# Patient Record
Sex: Male | Born: 1950 | Race: White | Hispanic: No | Marital: Married | Smoking: Former smoker
Health system: Southern US, Community
[De-identification: ages and names within clinical notes are randomized; demographics above are authoritative.]

## PROBLEM LIST (undated history)

## (undated) DIAGNOSIS — R531 Weakness: Secondary | ICD-10-CM

## (undated) DIAGNOSIS — I10 Essential (primary) hypertension: Secondary | ICD-10-CM

## (undated) DIAGNOSIS — R0602 Shortness of breath: Secondary | ICD-10-CM

## (undated) HISTORY — PX: APPENDECTOMY: SHX54

## (undated) HISTORY — DX: Weakness: R53.1

## (undated) HISTORY — DX: Essential (primary) hypertension: I10

## (undated) HISTORY — PX: BACK SURGERY: SHX140

## (undated) HISTORY — DX: Shortness of breath: R06.02

---

## 2001-01-15 ENCOUNTER — Ambulatory Visit: Admission: RE | Admit: 2001-01-15 | Discharge: 2001-01-15 | Payer: Self-pay | Admitting: Family Medicine

## 2012-08-24 HISTORY — PX: BACK SURGERY: SHX140

## 2012-08-24 HISTORY — PX: APPENDECTOMY: SHX54

## 2013-06-28 DIAGNOSIS — I10 Essential (primary) hypertension: Secondary | ICD-10-CM | POA: Insufficient documentation

## 2015-11-12 DIAGNOSIS — C490 Malignant neoplasm of connective and soft tissue of head, face and neck: Secondary | ICD-10-CM | POA: Diagnosis not present

## 2015-11-12 DIAGNOSIS — C44129 Squamous cell carcinoma of skin of left eyelid, including canthus: Secondary | ICD-10-CM | POA: Diagnosis not present

## 2015-11-12 DIAGNOSIS — C44122 Squamous cell carcinoma of skin of right eyelid, including canthus: Secondary | ICD-10-CM | POA: Diagnosis not present

## 2016-12-29 DIAGNOSIS — S82445B Nondisplaced spiral fracture of shaft of left fibula, initial encounter for open fracture type I or II: Secondary | ICD-10-CM | POA: Diagnosis not present

## 2017-01-05 DIAGNOSIS — S82832D Other fracture of upper and lower end of left fibula, subsequent encounter for closed fracture with routine healing: Secondary | ICD-10-CM | POA: Diagnosis not present

## 2017-01-23 DIAGNOSIS — S82832D Other fracture of upper and lower end of left fibula, subsequent encounter for closed fracture with routine healing: Secondary | ICD-10-CM | POA: Diagnosis not present

## 2017-02-20 DIAGNOSIS — S82832D Other fracture of upper and lower end of left fibula, subsequent encounter for closed fracture with routine healing: Secondary | ICD-10-CM | POA: Diagnosis not present

## 2017-03-24 DIAGNOSIS — H524 Presbyopia: Secondary | ICD-10-CM | POA: Diagnosis not present

## 2017-03-24 DIAGNOSIS — H5203 Hypermetropia, bilateral: Secondary | ICD-10-CM | POA: Diagnosis not present

## 2017-03-24 DIAGNOSIS — H52223 Regular astigmatism, bilateral: Secondary | ICD-10-CM | POA: Diagnosis not present

## 2017-08-29 DIAGNOSIS — I10 Essential (primary) hypertension: Secondary | ICD-10-CM | POA: Diagnosis not present

## 2017-09-23 DIAGNOSIS — R05 Cough: Secondary | ICD-10-CM | POA: Diagnosis not present

## 2017-09-23 DIAGNOSIS — R531 Weakness: Secondary | ICD-10-CM | POA: Diagnosis not present

## 2017-09-23 DIAGNOSIS — R0602 Shortness of breath: Secondary | ICD-10-CM | POA: Diagnosis not present

## 2017-09-23 DIAGNOSIS — J209 Acute bronchitis, unspecified: Secondary | ICD-10-CM | POA: Diagnosis not present

## 2017-10-05 DIAGNOSIS — R05 Cough: Secondary | ICD-10-CM | POA: Diagnosis not present

## 2017-10-05 DIAGNOSIS — J209 Acute bronchitis, unspecified: Secondary | ICD-10-CM | POA: Diagnosis not present

## 2017-11-19 DIAGNOSIS — R0602 Shortness of breath: Secondary | ICD-10-CM | POA: Diagnosis not present

## 2017-11-19 DIAGNOSIS — R911 Solitary pulmonary nodule: Secondary | ICD-10-CM | POA: Diagnosis not present

## 2017-11-19 DIAGNOSIS — R05 Cough: Secondary | ICD-10-CM | POA: Diagnosis not present

## 2017-11-19 DIAGNOSIS — R0989 Other specified symptoms and signs involving the circulatory and respiratory systems: Secondary | ICD-10-CM | POA: Diagnosis not present

## 2017-11-22 DIAGNOSIS — R0602 Shortness of breath: Secondary | ICD-10-CM | POA: Insufficient documentation

## 2017-11-22 NOTE — Progress Notes (Deleted)
Cardiology Office Note    Date:  11/22/2017   ID:  [name], DOB [birth year], MRN [id number]  PCP:  No primary care provider on file.  Cardiologist: Sinclair Grooms, MD   No chief complaint on file.   History of Present Illness:  David Castillo is a 67 y.o. male referred by Dr. Rory Percy for evaluation of dyspnea.    Past Medical History:  Diagnosis Date  . SOB (shortness of breath)     *** The histories are not reviewed yet. Please review them in the "History" navigator section and refresh this [place].  Current Medications: Outpatient Medications Prior to Visit  Medication Sig Dispense Refill  . hydrochlorothiazide (HYDRODIURIL) 25 MG tablet Take 25 mg by mouth daily.    . traMADol (ULTRAM) 50 MG tablet Take 1-2 tablets by mouth every 8 (eight) hours as needed for pain.    . hydrochlorothiazide (HYDRODIURIL) 12.5 MG tablet Take 1 tablet by mouth daily.     No facility-administered medications prior to visit.      Allergies:   Penicillins   Social History   Socioeconomic History  . Marital status: Not on file    Spouse name: Not on file  . Number of children: Not on file  . Years of education: Not on file  . Highest education level: Not on file  Social Needs  . Financial resource strain: Not on file  . Food insecurity - worry: Not on file  . Food insecurity - inability: Not on file  . Transportation needs - medical: Not on file  . Transportation needs - non-medical: Not on file  Occupational History  . Not on file  Tobacco Use  . Smoking status: Not on file  Substance and Sexual Activity  . Alcohol use: Not on file  . Drug use: Not on file  . Sexual activity: Not on file  Other Topics Concern  . Not on file  Social History Narrative  . Not on file     Family History:  The patient's ***family history is not on file.   ROS:   Please see the history of present illness.    ***  All other systems reviewed and are negative.   PHYSICAL  EXAM:   VS:  There were no vitals taken for this visit.   GEN: Well nourished, well developed, in no acute distress  HEENT: normal  Neck: no JVD, carotid bruits, or masses Cardiac: ***RRR; no murmurs, rubs, or gallops,no edema  Respiratory:  clear to auscultation bilaterally, normal work of breathing GI: soft, nontender, nondistended, + BS MS: no deformity or atrophy  Skin: warm and dry, no rash Neuro:  Alert and Oriented x 3, Strength and sensation are intact Psych: euthymic mood, full affect  Wt Readings from Last 3 Encounters:  No data found for Wt      Studies/Labs Reviewed:   EKG:  EKG  ***  Recent Labs: No results found for requested labs within last 8760 hours.   Lipid Panel No results found for: CHOL, TRIG, HDL, CHOLHDL, VLDL, LDLCALC, LDLDIRECT  Additional studies/ records that were reviewed today include:  ***    ASSESSMENT:    1. Dyspnea, unspecified type      PLAN:  In order of problems listed above:  1. ***    Medication Adjustments/Labs and Tests Ordered: Current medicines are reviewed at length with the patient today.  Concerns regarding medicines are outlined above.  Medication changes, Labs and Tests ordered today  are listed in the Patient Instructions below. There are no Patient Instructions on file for this visit.   Signed, Sinclair Grooms, MD  11/22/2017 8:53 PM    [facility] [address], [place], [place]  [address] Phone: [phone]; Fax: [phone]

## 2017-11-23 ENCOUNTER — Ambulatory Visit: Payer: Self-pay | Admitting: Interventional Cardiology

## 2017-11-27 DIAGNOSIS — R531 Weakness: Secondary | ICD-10-CM | POA: Diagnosis not present

## 2017-11-27 DIAGNOSIS — I10 Essential (primary) hypertension: Secondary | ICD-10-CM | POA: Diagnosis not present

## 2017-11-27 DIAGNOSIS — R0602 Shortness of breath: Secondary | ICD-10-CM | POA: Diagnosis not present

## 2017-12-03 DIAGNOSIS — I517 Cardiomegaly: Secondary | ICD-10-CM | POA: Diagnosis not present

## 2017-12-03 DIAGNOSIS — R0602 Shortness of breath: Secondary | ICD-10-CM | POA: Diagnosis not present

## 2017-12-03 DIAGNOSIS — I700 Atherosclerosis of aorta: Secondary | ICD-10-CM | POA: Diagnosis not present

## 2017-12-03 DIAGNOSIS — J45909 Unspecified asthma, uncomplicated: Secondary | ICD-10-CM | POA: Diagnosis not present

## 2017-12-03 LAB — PULMONARY FUNCTION TEST

## 2017-12-21 ENCOUNTER — Encounter: Payer: Self-pay | Admitting: Pulmonary Disease

## 2017-12-21 ENCOUNTER — Ambulatory Visit: Payer: Medicare Other | Admitting: Pulmonary Disease

## 2017-12-21 DIAGNOSIS — R05 Cough: Secondary | ICD-10-CM | POA: Diagnosis not present

## 2017-12-21 DIAGNOSIS — J849 Interstitial pulmonary disease, unspecified: Secondary | ICD-10-CM | POA: Diagnosis not present

## 2017-12-21 DIAGNOSIS — R053 Chronic cough: Secondary | ICD-10-CM

## 2017-12-21 NOTE — Patient Instructions (Signed)
You likely had a bad case of acute bronchitis that is resolving. You may have mild scarring at the bases of the lungs.  Stay on Symbicort until symptoms resolve.  High-resolution CT of the chest in 6 months

## 2017-12-21 NOTE — Assessment & Plan Note (Signed)
He does seem to have had a attack of severe acute bronchitis with some hypoxia and bronchospasm which took 3 months to resolve.  He is now 80% improved but a cough with clear sputum still lingers. No infiltrates are noted on his CT scan which is reassuring.  He certainly does not need further antibiotics or  Steroids. Difficult to interpret spirometry when he was acutely ill but the pre-bronchodilator FEV1 ratio does appear to be low at 68 hence okay to continue taking Symbicort. We will recheck pre-and post spirometry on a future visit in 3 months.  I doubt that he has significant COPD

## 2017-12-21 NOTE — Assessment & Plan Note (Signed)
CT scan was read as mosaic attenuation and small airways disease which could be related to the episode of acute bronchitis.  However I am more worried about early ILD especially on my review of the CT scan. We will need a high-resolution CT scan, also note that he has bibasilar crackles on exam will wait for complete resolution of current Symptoms before checking

## 2017-12-21 NOTE — Progress Notes (Signed)
Subjective:    Patient ID: David Castillo, male    DOB: 03/08/1951, 67 y.o.   MRN: [id number]  HPI  Chief Complaint  Patient presents with  . Pulm Consult    Referred by Dr.Howard at [facility] in [place]. Per patient's wife, patient has been SOB since before December 2018. Increased fatigue. Increased coughing, productive cough with brown/black phlegm.    67 year old remote smoker presents for evaluation of persistent shortness of breath and "chest congestion". 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  He is convinced that he had flulike symptoms around then although he tested negative.  He had severe fatigue, chest congestion with green/yellow sputum production, his wife shows me a picture and increased wheezing.  Since then he has been treated with 2 rounds of antibiotics and 2 rounds of steroids.  He also received codeine cough syrup with 2 refills. His dyspnea is about 80% improved, he continues to have congestion but now has clear sputum, his wheezing has almost resolved.  He feels that wheezing improved most significantly after starting on Symbicort, but admits that he is only taking 1-2 puffs once daily mostly on an as-needed basis. He has lost 25 pounds from his previous weight of 244 to his current weight of 219 pounds.  He denies hemoptysis or fevers although he had night sweats about 2 nights ago. He was noted to be hypoxic as low as 86% on exertion, today measures 93%  He smoked about a pack per day for about 20 years until he quit in 1978.  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  FEV1 did not change much. Echo 12/03/17 showed  mild LVH, mildly dilated left atrium, grade 1 diastolic dysfunction.  CT chest 11/19/17 showed mosaic perfusion of the lungs and to my review, mild scarring at the bases, 8 mm groundglass opacity right lung apex, ascending thoracic aorta 40 mm      No past medical history on file.   History reviewed. No pertinent surgical history. Appendectomy 66, back surgery 1972   Allergies  Allergen Reactions  . Penicillins Anaphylaxis    Per patient, this is a childhood allergy.     Social History   Socioeconomic History  . Marital status: Married    Spouse name: Not on file  . Number of children: Not on file  . Years of education: Not on file  . Highest education level: Not on file  Occupational History  . Not on file  Social Needs  . Financial resource strain: Not on file  . Food insecurity:    Worry: Not on file    Inability: Not on file  . Transportation needs:    Medical: Not on file    Non-medical: Not on file  Tobacco Use  . Smoking status: Former Smoker    Last attempt to quit: 09/22/1976    Years since quitting: 41.2  . Smokeless tobacco: Never Used  Substance and Sexual Activity  . Alcohol use: Not on file  . Drug use: Not on file  . Sexual activity: Not on file  Lifestyle  . Physical activity:    Days per week: Not  on file    Minutes per session: Not on file  . Stress: Not on file  Relationships  . Social connections:    Talks on phone: Not on file    Gets together: Not on file    Attends religious service: Not on file    Active member of club or organization: Not on file    Attends meetings of clubs or organizations: Not on file    Relationship status: Not on file  . Intimate partner violence:    Fear of current or ex partner: Not on file    Emotionally abused: Not on file    Physically abused: Not on file    Forced sexual activity: Not on file  Other Topics Concern  . Not on file  Social History Narrative  . Not on file      History reviewed. No  pertinent family history. Heart disease in both parents, colon cancer in mother   Review of Systems  Constitutional: Positive for unexpected weight change. Negative for fever.  HENT: Positive for postnasal drip. Negative for congestion, dental problem, ear pain, nosebleeds, rhinorrhea, sinus pressure, sneezing, sore throat and trouble swallowing.   Eyes: Negative for redness and itching.  Respiratory: Positive for cough, chest tightness and shortness of breath. Negative for wheezing.   Cardiovascular: Positive for chest pain. Negative for palpitations and leg swelling.  Gastrointestinal: Positive for abdominal pain. Negative for nausea and vomiting.  Genitourinary: Negative for dysuria.  Musculoskeletal: Negative for joint swelling.  Skin: Negative for rash.  Allergic/Immunologic: Negative.  Negative for environmental allergies, food allergies and immunocompromised state.  Neurological: Negative for headaches.  Hematological: Does not bruise/bleed easily.  Psychiatric/Behavioral: Negative for dysphoric mood. The patient is not nervous/anxious.        Objective:   Physical Exam  Gen. Pleasant, well-nourished, in no distress, normal affect ENT - no lesions, no post nasal drip Neck: No JVD, no thyromegaly, no carotid bruits Lungs: no use of accessory muscles, no dullness to percussion, fine bibasilar rales no rhonchi  Cardiovascular: Rhythm regular, heart sounds  normal, no murmurs or gallops, no peripheral edema Abdomen: soft and non-tender, no hepatosplenomegaly, BS normal. Musculoskeletal: No deformities, no cyanosis or clubbing Neuro:  alert, non focal       Assessment & Plan:

## 2017-12-23 ENCOUNTER — Encounter: Payer: Self-pay | Admitting: *Deleted

## 2017-12-24 ENCOUNTER — Ambulatory Visit: Payer: Self-pay | Admitting: Cardiovascular Disease

## 2018-01-01 ENCOUNTER — Institutional Professional Consult (permissible substitution): Payer: Self-pay | Admitting: Pulmonary Disease

## 2018-01-15 ENCOUNTER — Ambulatory Visit: Payer: Self-pay | Admitting: Cardiovascular Disease

## 2018-02-02 ENCOUNTER — Ambulatory Visit: Payer: Medicare Other | Admitting: Cardiovascular Disease

## 2018-02-02 ENCOUNTER — Encounter: Payer: Self-pay | Admitting: *Deleted

## 2018-02-02 ENCOUNTER — Encounter

## 2018-02-02 ENCOUNTER — Encounter: Payer: Self-pay | Admitting: Cardiovascular Disease

## 2018-02-02 ENCOUNTER — Other Ambulatory Visit: Payer: Self-pay

## 2018-02-02 VITALS — BP 165/88 | HR 69 | Ht 72.0 in | Wt 228.0 lb

## 2018-02-02 DIAGNOSIS — I10 Essential (primary) hypertension: Secondary | ICD-10-CM | POA: Diagnosis not present

## 2018-02-02 DIAGNOSIS — R0609 Other forms of dyspnea: Secondary | ICD-10-CM

## 2018-02-02 NOTE — Progress Notes (Addendum)
CARDIOLOGY CONSULT NOTE  Patient ID: David Castillo MRN: [id number] DOB/AGE: 10/05/1950 67 y.o.  Admit date: (Not on file) Primary Physician: Rory Percy, MD Referring Physician: Rory Percy, MD  Reason for Consultation: Exertional dyspnea  HPI: David Castillo is a 67 y.o. male who is being seen today for the evaluation of exertional dyspnea at the request of Lanelle Bal, [place].   Past medical history includes hypertension.  I reviewed all relevant documentation, labs, and studies provided by his PCP.  He reportedly has a 63-month history of exertional dyspnea.  He had a 20 to 30 pound weight loss in 2 months.  He has been expensing a productive cough.  Oxygen saturations have been 89-91% at rest dropping down to 86% with exertion.  He is a previous smoker and quit at age 47 but smoked 2 packs/day for 20 years.  It appears a stress test, echocardiogram, and pulmonary function testing were ordered by his PCP.  I personally reviewed the recent ECG which demonstrated sinus rhythm with nonspecific T wave abnormalities in high lateral leads.  Labs 11/11/2017: BUN 13, creatinine 0.71.  Additional labs 09/23/2017: Hemoglobin 15.6, platelets 234, normal troponin, N-terminal proBNP 100.1.  At the present time, I do not have results of the echocardiogram, CT scan, pulmonary function tests, and stress test.  He saw pulmonary on 12/21/2017.  It was not felt he had significant COPD.  It was felt that the CT scan showed mosaic attenuation and small airways disease but there was also concern for early interstitial lung disease.  A high-resolution CT scan was ordered which has not been performed.  He told me he was experiencing cough and congestion for about 3 months.  He was extremely fatigued.  He then used an inhaler and began to cough up sputum.  He was switched to a different inhaler and he began to feel much better.  He now denies exertional chest pain, dyspnea, and fatigue.  He is now  walking 1 mile daily with his wife.  He had been on hydrochlorothiazide but it made him feel dizzy and he stopped it.  He took lisinopril for a week but did not tolerate it.  He checks his blood pressure at home routinely and it is normal.  He initially went from 245 down to 216 pounds but is now back up to 228 pounds.  Family history: Father died of CVA at age 53.  He was a smoker.  He also had a carotid endarterectomy.  Mother developed coronary artery disease in her mid 6s and had a stent.  She was a smoker.  His brother had an MI at age 59 and he is a smoker.       Allergies  Allergen Reactions  . Penicillins Anaphylaxis    Per patient, this is a childhood allergy.     No current outpatient medications on file.   No current facility-administered medications for this visit.     Past Medical History:  Diagnosis Date  . Hypertension   . Shortness of breath   . Weakness     Past Surgical History:  Procedure Laterality Date  . APPENDECTOMY    . BACK SURGERY      Social History   Socioeconomic History  . Marital status: Married    Spouse name: Not on file  . Number of children: Not on file  . Years of education: Not on file  . Highest education level: Not on file  Occupational  History  . Not on file  Social Needs  . Financial resource strain: Not on file  . Food insecurity:    Worry: Not on file    Inability: Not on file  . Transportation needs:    Medical: Not on file    Non-medical: Not on file  Tobacco Use  . Smoking status: Former Smoker    Last attempt to quit: 09/22/1976    Years since quitting: 41.3  . Smokeless tobacco: Never Used  Substance and Sexual Activity  . Alcohol use: Not on file  . Drug use: Not on file  . Sexual activity: Not on file  Lifestyle  . Physical activity:    Days per week: Not on file    Minutes per session: Not on file  . Stress: Not on file  Relationships  . Social connections:    Talks on phone: Not on file    Gets  together: Not on file    Attends religious service: Not on file    Active member of club or organization: Not on file    Attends meetings of clubs or organizations: Not on file    Relationship status: Not on file  . Intimate partner violence:    Fear of current or ex partner: Not on file    Emotionally abused: Not on file    Physically abused: Not on file    Forced sexual activity: Not on file  Other Topics Concern  . Not on file  Social History Narrative  . Not on file      No outpatient medications have been marked as taking for the 02/02/18 encounter (Office Visit) with [name], MD.      Review of systems complete and found to be negative unless listed above in HPI    Physical exam Blood pressure (!) 165/88, pulse 69, height 6' (1.829 m), weight 228 lb (103.4 kg), SpO2 96 %. General: NAD Neck: No JVD, no thyromegaly or thyroid nodule.  Lungs: Clear to auscultation bilaterally with normal respiratory effort. CV: Nondisplaced PMI. Regular rate and rhythm, normal S1/S2, no S3/S4, no murmur.  No peripheral edema.  No carotid bruit.   Abdomen: Soft, nontender, no distention.  Skin: Intact without lesions or rashes.  Neurologic: Alert and oriented x 3.  Psych: Normal affect. Extremities: No clubbing or cyanosis.  HEENT: Normal.   ECG: Most recent ECG reviewed.   Labs: No results found for: K, BUN, CREATININE, ALT, TSH, HGB   Lipids: No results found for: LDLCALC, LDLDIRECT, CHOL, TRIG, HDL      ASSESSMENT AND PLAN:  1.  Exertional dyspnea and hypoxemia: Symptoms have resolved and appear to have been pulmonary in etiology.  I will request echocardiogram, stress test, and pulmonary function test results as well as a CT scan.  Oxygen saturations are 96% and he denies exertional symptoms altogether.  At this time, no further testing is indicated.  2.  Hypertension: Blood pressure is elevated.  However, he checks it routinely at home and it is  normal.     Disposition: Follow up as needed   Signed: Kate Sable, M.D., F.A.C.C.  02/02/2018, 9:35 AM   Addendum:  I reviewed records obtained from [facility].  Echocardiogram demonstrated normal left ventricular systolic function, LVEF 55 to 60%, mild LVH, mild left atrial dilatation, mild aortic sclerosis, mildly dilated ascending aorta.  It was 4.1 cm.  Nuclear stress test showed no reversible ischemia or infarction, LVEF 68%.  It was a low risk  study.

## 2018-02-02 NOTE — Patient Instructions (Signed)
Medication Instructions:  Continue all current medications.  Labwork: none  Testing/Procedures: none  Follow-Up: As needed.    Any Other Special Instructions Will Be Listed Below (If Applicable).  If you need a refill on your cardiac medications before your next appointment, please call your pharmacy.  

## 2018-02-03 ENCOUNTER — Telehealth: Payer: Self-pay | Admitting: *Deleted

## 2018-02-03 NOTE — Telephone Encounter (Signed)
David Castillo today  Received: Yesterday - review of [name] notes -   Message Contents  [name], MD  Laurine Blazer, LPN        He had very mild ascending aortic dilatation. Would recommend his PCP follow up on this with annual imaging if deemed necessary. It may have always been like this and part of his normal anatomy.   He does not need to follow up with me.   Stress test was normal. Echo showed normal pumping function.

## 2018-02-03 NOTE — Telephone Encounter (Signed)
Wife notified & appreciative of the call.

## 2018-06-24 ENCOUNTER — Ambulatory Visit (HOSPITAL_COMMUNITY): Payer: Medicare Other

## 2018-08-02 ENCOUNTER — Telehealth: Payer: Self-pay | Admitting: Pulmonary Disease

## 2018-08-02 NOTE — Telephone Encounter (Signed)
Left message for Deanna to call back

## 2018-08-03 NOTE — Telephone Encounter (Signed)
Noted.  Will close encounter.  

## 2018-08-03 NOTE — Telephone Encounter (Signed)
Called and spoke with pt's spouse Deanna letting her know that an order had been placed for pt to have CT scan performed 10/3 but the scan was cancelled by pt.  Per Deanna, pt's mom has been under hospice care at their home and that was the reason the scan was cancelled.  Pt is ready to have the scan performed.  PCCs, can you help [place] out with this. Please let us know if we need to place another order or if the previous order for the scan will be okay. Thanks!

## 2018-08-03 NOTE — Telephone Encounter (Signed)
Called David Castillo but there was no answer. Left message to call back.

## 2018-08-03 NOTE — Telephone Encounter (Signed)
Order is still showing active. Pt should be able to call Central Scheduling & reschedule appt.  Ph # [phone].

## 2018-08-03 NOTE — Telephone Encounter (Signed)
Patients wife called about CT scan.  Gave her central scheduling phone number.  No call back needed.

## 2018-08-20 ENCOUNTER — Ambulatory Visit (HOSPITAL_COMMUNITY): Payer: Medicare Other | Attending: Pulmonary Disease

## 2018-10-07 ENCOUNTER — Ambulatory Visit (HOSPITAL_COMMUNITY): Payer: Medicare Other

## 2018-10-21 ENCOUNTER — Ambulatory Visit (HOSPITAL_COMMUNITY)
Admission: RE | Admit: 2018-10-21 | Discharge: 2018-10-21 | Disposition: A | Discharge status: Home / Self Care | Payer: Medicare Other | Source: Ambulatory Visit | Attending: Pulmonary Disease | Admitting: Pulmonary Disease

## 2018-10-21 DIAGNOSIS — J849 Interstitial pulmonary disease, unspecified: Secondary | ICD-10-CM | POA: Insufficient documentation

## 2018-10-21 DIAGNOSIS — R0602 Shortness of breath: Secondary | ICD-10-CM | POA: Diagnosis not present

## 2018-10-28 ENCOUNTER — Telehealth: Payer: Self-pay | Admitting: Pulmonary Disease

## 2018-10-28 NOTE — Telephone Encounter (Signed)
Called and spoke with Deanna (DPR). Dr Elsworth Soho CT recommendations given.  Appointment scheduled with Dr Elsworth Soho, 11/14/18, at 2:30pm.  Nothing further at this time.

## 2018-11-04 ENCOUNTER — Ambulatory Visit: Payer: Medicare Other | Admitting: Pulmonary Disease

## 2018-11-18 ENCOUNTER — Ambulatory Visit: Payer: Medicare Other | Admitting: Pulmonary Disease

## 2018-11-23 ENCOUNTER — Encounter: Payer: Self-pay | Admitting: Cardiovascular Disease

## 2018-11-29 ENCOUNTER — Ambulatory Visit: Payer: Medicare Other | Admitting: Pulmonary Disease

## 2018-12-01 ENCOUNTER — Other Ambulatory Visit: Payer: Self-pay

## 2018-12-01 ENCOUNTER — Ambulatory Visit: Payer: Medicare Other | Admitting: Pulmonary Disease

## 2018-12-01 ENCOUNTER — Encounter: Payer: Self-pay | Admitting: Pulmonary Disease

## 2018-12-01 VITALS — BP 118/72 | HR 70 | Wt 240.0 lb

## 2018-12-01 DIAGNOSIS — J849 Interstitial pulmonary disease, unspecified: Secondary | ICD-10-CM | POA: Diagnosis not present

## 2018-12-01 MED ORDER — PREDNISONE 10 MG PO TABS: ORAL_TABLET | ORAL | 0 refills | Status: DC

## 2018-12-01 NOTE — Patient Instructions (Addendum)
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

## 2018-12-01 NOTE — Progress Notes (Signed)
   Subjective:    Patient ID: David Castillo, male    DOB: 11/08/1950, 68 y.o.   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 Follow-up    Had CT scan back in January 2020. Patient is still having issues with SOB.    Initial consult 12/2017 after an episode of acute bronchitis, cough resolved but he continues to have persistent shortness of breath. Imaging then showed mild mosaic perfusion at the lung bases and spirometry suggested mild airway obstruction which could be attributed to recovering bronchitis.  He was given Symbicort and albuterol, Symbicort has not helped him much and he is stopped taking this over the past few months.  Albuterol helped him more.  We reviewed repeat HRCT today.  He also reports intermittent wheezing especially on exertion Accompanied by his wife today who corroborates his symptoms  Significant tests/ events reviewed  HRCT 10/21/18 >>" indeterminate ", scattered areas of septal thickening and subpleural reticulation in both lower lobes, stable compared to 10/2017  Spirometry 11/2016 showed ratio of 68 with FEV1 of 72% and FVC 53% that improved 18% with bronchodilators of 63%.  FEV1 did not change much. Echo 12/03/17 showed mild LVH, mildly dilated left atrium, grade 1 diastolic dysfunction.  CT chest 11/19/17 showed mosaic perfusion of the lungs and to my review, mild scarring at the bases, 8 mm groundglass opacity right lung apex, ascending thoracic aorta 40 mm  Review of Systems neg for any significant sore throat, dysphagia, itching, sneezing, nasal congestion or excess/ purulent secretions, fever, chills, sweats, unintended wt loss, pleuritic or exertional cp, hempoptysis, orthopnea pnd or change in chronic leg swelling. Also denies presyncope, palpitations, heartburn, abdominal pain, nausea, vomiting,  diarrhea or change in bowel or urinary habits, dysuria,hematuria, rash, arthralgias, visual complaints, headache, numbness weakness or ataxia.     Objective:   Physical Exam  Gen. Pleasant, obese, in no distress ENT - no lesions, no post nasal drip Neck: No JVD, no thyromegaly, no carotid bruits Lungs: no use of accessory muscles, no dullness to percussion,bibasal rales no rhonchi  Cardiovascular: Rhythm regular, heart sounds  normal, no murmurs or gallops, no peripheral edema Musculoskeletal: No deformities, no cyanosis or clubbing , no tremors       Assessment & Plan:

## 2018-12-02 ENCOUNTER — Encounter: Payer: Self-pay | Admitting: Pulmonary Disease

## 2018-12-02 NOTE — Assessment & Plan Note (Signed)
Appears to be unchanged compared to 2019 but he continues to have persistent dyspnea He does not want to repeat PFTs.  Spirometry in the past has shown moderate airway obstruction but Symbicort did not help him much  Will give a short course of prednisone as a treatment trial he will report back if this helps. If not, we will refill his Symbicort or change her to alternative LABA/LAMA combination

## 2018-12-03 LAB — CBC WITH DIFFERENTIAL/PLATELET
ABSOLUTE MONOCYTES: 564 {cells}/uL (ref 200–950)
Basophils Absolute: 31 cells/uL (ref 0–200)
Basophils Relative: 0.5 %
Eosinophils Absolute: 285 cells/uL (ref 15–500)
Eosinophils Relative: 4.6 %
HCT: 41.4 % (ref 38.5–50.0)
Hemoglobin: 14.7 g/dL (ref 13.2–17.1)
Lymphs Abs: 2220 cells/uL (ref 850–3900)
MCH: 30.2 pg (ref 27.0–33.0)
MCHC: 35.5 g/dL (ref 32.0–36.0)
MCV: 85 fL (ref 80.0–100.0)
MPV: 10.2 fL (ref 7.5–12.5)
Monocytes Relative: 9.1 %
Neutro Abs: 3100 cells/uL (ref 1500–7800)
Neutrophils Relative %: 50 %
Platelets: 230 10*3/uL (ref 140–400)
RBC: 4.87 10*6/uL (ref 4.20–5.80)
RDW: 12.4 % (ref 11.0–15.0)
Total Lymphocyte: 35.8 %
WBC: 6.2 10*3/uL (ref 3.8–10.8)

## 2018-12-03 LAB — COMPREHENSIVE METABOLIC PANEL
AG Ratio: 1.5 (calc) (ref 1.0–2.5)
ALBUMIN MSPROF: 4.1 g/dL (ref 3.6–5.1)
ALKALINE PHOSPHATASE (APISO): 55 U/L (ref 35–144)
ALT: 21 U/L (ref 9–46)
AST: 19 U/L (ref 10–35)
BUN: 14 mg/dL (ref 7–25)
CO2: 30 mmol/L (ref 20–32)
Calcium: 9.6 mg/dL (ref 8.6–10.3)
Chloride: 100 mmol/L (ref 98–110)
Creat: 0.88 mg/dL (ref 0.70–1.25)
Globulin: 2.7 g/dL (calc) (ref 1.9–3.7)
Glucose, Bld: 88 mg/dL (ref 65–99)
Potassium: 4.5 mmol/L (ref 3.5–5.3)
Sodium: 140 mmol/L (ref 135–146)
Total Bilirubin: 0.4 mg/dL (ref 0.2–1.2)
Total Protein: 6.8 g/dL (ref 6.1–8.1)

## 2018-12-03 LAB — CYCLIC CITRUL PEPTIDE ANTIBODY, IGG: Cyclic Citrullin Peptide Ab: <16 UNITS

## 2018-12-03 LAB — ANA: Anti Nuclear Antibody(ANA): NEGATIVE

## 2018-12-03 LAB — SEDIMENTATION RATE: Sed Rate: 14 mm/h (ref 0–20)

## 2018-12-10 ENCOUNTER — Telehealth: Payer: Self-pay | Admitting: Pulmonary Disease

## 2018-12-10 NOTE — Telephone Encounter (Signed)
Called patient, unable to reach and unable to leave message.  

## 2018-12-13 NOTE — Telephone Encounter (Signed)
Called and spoke with patient regarding results.  Informed the patient of results and recommendations today. Pt verbalized understanding and denied any questions or concerns at this time.  Nothing further needed.  

## 2019-01-18 ENCOUNTER — Telehealth: Payer: Self-pay | Admitting: Pulmonary Disease

## 2019-01-18 MED ORDER — ALBUTEROL SULFATE HFA 108 (90 BASE) MCG/ACT IN AERS: 2.0000 | INHALATION_SPRAY | RESPIRATORY_TRACT | 2 refills | Status: AC | PRN

## 2019-01-18 NOTE — Telephone Encounter (Signed)
That is fine , it is mentioned in Dr. Elsworth Soho  Note from march 2020  Please call pharmacy and verify .  Can send in 2 refills  proair 2 puffs every 4hr As needed  Wheezing -rescue inhaler #2 refills only   Cont w/ ov recs Please contact office for sooner follow up if symptoms do not improve or worsen or seek emergency care  follow up as planned and As needed

## 2019-01-18 NOTE — Telephone Encounter (Signed)
ATC Patient.  Left message for Patient to call back when available.

## 2019-01-18 NOTE — Telephone Encounter (Signed)
Call made to pharmacy, confirmed medication.Refill sent. Call made to patient wife to make aware. Nothing further is needed at this time.

## 2019-01-18 NOTE — Telephone Encounter (Signed)
Pt wife Tilda Burrow is calling back [phone]

## 2019-01-18 NOTE — Telephone Encounter (Signed)
Called returned to patient wife (dpr), requesting copy of labs and CT be faxed to their home. Fax number confirmed [phone]. Reports faxed.   Also requesting a refill of pro-air. Wife states this was previously prescribed by his PCP but they asked that [facility] manage this moving forward. Pharmacy [facility].   No mention of pro-air on AVS.   TP please advise if okay to send script for Pro-air in Dr. Bari Mantis absence. Thanks.

## 2019-02-18 ENCOUNTER — Other Ambulatory Visit: Payer: Self-pay | Admitting: Pulmonary Disease

## 2019-03-14 ENCOUNTER — Other Ambulatory Visit (HOSPITAL_COMMUNITY): Admission: RE | Admit: 2019-03-14 | Payer: Medicare Other | Source: Ambulatory Visit

## 2019-03-17 ENCOUNTER — Ambulatory Visit: Payer: Medicare Other | Admitting: Pulmonary Disease

## 2019-09-28 DIAGNOSIS — Z0001 Encounter for general adult medical examination with abnormal findings: Secondary | ICD-10-CM | POA: Diagnosis not present

## 2019-09-28 DIAGNOSIS — R079 Chest pain, unspecified: Secondary | ICD-10-CM | POA: Diagnosis not present

## 2019-09-28 DIAGNOSIS — R0602 Shortness of breath: Secondary | ICD-10-CM | POA: Diagnosis not present

## 2019-09-28 DIAGNOSIS — I10 Essential (primary) hypertension: Secondary | ICD-10-CM | POA: Diagnosis not present

## 2019-10-04 DIAGNOSIS — R42 Dizziness and giddiness: Secondary | ICD-10-CM | POA: Diagnosis not present

## 2019-10-04 DIAGNOSIS — R519 Headache, unspecified: Secondary | ICD-10-CM | POA: Diagnosis not present

## 2019-10-12 ENCOUNTER — Other Ambulatory Visit: Payer: Self-pay

## 2019-10-12 DIAGNOSIS — I6529 Occlusion and stenosis of unspecified carotid artery: Secondary | ICD-10-CM

## 2019-10-13 ENCOUNTER — Ambulatory Visit (HOSPITAL_COMMUNITY)
Admission: RE | Admit: 2019-10-13 | Discharge: 2019-10-13 | Disposition: A | Discharge status: Home / Self Care | Payer: Medicare Other | Source: Ambulatory Visit | Attending: Vascular Surgery | Admitting: Vascular Surgery

## 2019-10-13 ENCOUNTER — Ambulatory Visit (INDEPENDENT_AMBULATORY_CARE_PROVIDER_SITE_OTHER): Payer: Medicare Other | Admitting: Vascular Surgery

## 2019-10-13 ENCOUNTER — Other Ambulatory Visit: Payer: Self-pay

## 2019-10-13 ENCOUNTER — Encounter: Payer: Self-pay | Admitting: Vascular Surgery

## 2019-10-13 VITALS — BP 181/96 | HR 82 | Temp 98.0°F | Resp 18 | Ht 71.0 in | Wt 238.0 lb

## 2019-10-13 DIAGNOSIS — I6502 Occlusion and stenosis of left vertebral artery: Secondary | ICD-10-CM | POA: Diagnosis not present

## 2019-10-13 DIAGNOSIS — I6529 Occlusion and stenosis of unspecified carotid artery: Secondary | ICD-10-CM | POA: Insufficient documentation

## 2019-10-13 NOTE — Progress Notes (Signed)
REASON FOR CONSULT:    Occlusion of left vertebral artery.  The consult is requested by Dr. Lanelle Bal.  ASSESSMENT & PLAN:   OCCLUDED LEFT VERTEBRAL ARTERY: The patient's MRI and duplex scan do suggest a stenosis of the left vertebral artery however this is asymptomatic.  He has no symptoms of vertebrobasilar insufficiency no evidence of subclavian steal.  He has no significant carotid disease.  Thus this is simply an incidental finding and no further work-up is indicated.  I have encouraged him to take 81 mg of aspirin daily.  I will be happy to see him back at any time if any new vascular issues arise.  From my standpoint however no routine carotid follow-up studies are indicated at this time.   Deitra Mayo, MD Office: [phone]   HPI:   David Castillo is a pleasant 69 y.o. male, who underwent an MRI to work-up headaches and an incidental finding was possible slow flow versus occlusion of the left vertebral artery.  For this reason the patient was sent for vascular consultation.  I have reviewed the records from the referring office.  The CT was done on 10/04/2019.  There was an abnormal signal in the left vertebral artery suggesting slow flow or occlusion of the left vertebral artery.  There were no other acute findings to explain the patient's headaches.  I reviewed the office note from 09/29/2019 prior to the MRI.  The patient was complaining of a headache that was located in the retro-orbital area.  This prompted the MRI.  Patient was also having some occasional shortness of breath cough.  On my history, the patient is right-handed.  He denies any history of stroke, TIAs, expressive or receptive aphasia, or amaurosis fugax.  He does occasionally get dizziness but this is only associated with standing up quickly.  Sounds like he may have some history of orthostatic hypotension.  He did have a large object fall on his head in the past.  His main complaints are weakness and  shortness of breath.  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  He occasionally takes aspirin but not routinely.  His risk factors for peripheral vascular disease include hypertension, hypercholesterolemia, a family history of premature cardiovascular disease, and a remote history of tobacco use.  He denies any history of diabetes.  Past Medical History:  Diagnosis Date  . Hypertension   . Shortness of breath   . SOB (shortness of breath)   . Weakness     History reviewed. No pertinent family history.  SOCIAL HISTORY: Social History   Socioeconomic History  . Marital status: Married    Spouse name: Not on file  . Number of children: Not on file  . Years of education: Not on file  . Highest education level: Not on file  Occupational History  . Not on file  Tobacco Use  . Smoking status: Former Smoker    Quit date: 09/22/1976    Years since quitting: 43.0  . Smokeless tobacco: Never Used  Substance and Sexual Activity  . Alcohol use: Yes    Alcohol/week: 2.0 standard drinks    Types: 1 Cans of beer, 1 Glasses of wine per week    Comment: weekly  . Drug use: Never  . Sexual activity: Not on file  Other Topics Concern  . Not on file  Social History Narrative   ** Merged History Encounter **       Social Determinants of  Health   Financial Resource Strain:   . Difficulty of Paying Living Expenses: Not on file  Food Insecurity:   . Worried About Charity fundraiser in the Last Year: Not on file  . Ran Out of Food in the Last Year: Not on file  Transportation Needs:   . Lack of Transportation (Medical): Not on file  . Lack of Transportation (Non-Medical): Not on file  Physical Activity:   . Days of Exercise per Week: Not on file  . Minutes of Exercise per Session: Not on file  Stress:   . Feeling of Stress : Not on file  Social Connections:   . Frequency of Communication with Friends and Family: Not on file  . Frequency of  Social Gatherings with Friends and Family: Not on file  . Attends Religious Services: Not on file  . Active Member of Clubs or Organizations: Not on file  . Attends Archivist Meetings: Not on file  . Marital Status: Not on file  Intimate Partner Violence:   . Fear of Current or Ex-Partner: Not on file  . Emotionally Abused: Not on file  . Physically Abused: Not on file  . Sexually Abused: Not on file    Allergies  Allergen Reactions  . Penicillins Anaphylaxis    Per patient, this is a childhood allergy.   . Penicillins     Current Outpatient Medications  Medication Sig Dispense Refill  . albuterol (PROAIR HFA) 108 (90 Base) MCG/ACT inhaler Inhale 2 puffs into the lungs every 4 (four) hours as needed for wheezing or shortness of breath. 1 Inhaler 2  . lisinopril (ZESTRIL) 20 MG tablet Take 20 mg by mouth daily.    . predniSONE (DELTASONE) 10 MG tablet Take 2 tabs daily for 5 days, then 1 tab daily for 5 days, then STOP 15 tablet 0  . traMADol (ULTRAM) 50 MG tablet Take 1-2 tablets by mouth every 8 (eight) hours as needed for pain.    . hydrochlorothiazide (HYDRODIURIL) 12.5 MG tablet Take 1 tablet by mouth daily.     No current facility-administered medications for this visit.    REVIEW OF SYSTEMS:  [X]  denotes positive finding, [ ]  denotes negative finding Cardiac  Comments:  Chest pain or chest pressure: x   Shortness of breath upon exertion: x   Short of breath when lying flat: x   Irregular heart rhythm:        Vascular    Pain in calf, thigh, or hip brought on by ambulation:    Pain in feet at night that wakes you up from your sleep:     Blood clot in your veins:    Leg swelling:         Pulmonary    Oxygen at home:    Productive cough:     Wheezing:  x       Neurologic    Sudden weakness in arms or legs:     Sudden numbness in arms or legs:     Sudden onset of difficulty speaking or slurred speech:    Temporary loss of vision in one eye:       Problems with dizziness:  x       Gastrointestinal    Blood in stool:     Vomited blood:         Genitourinary    Burning when urinating:     Blood in urine:        Psychiatric  Major depression:         Hematologic    Bleeding problems:    Problems with blood clotting too easily:        Skin    Rashes or ulcers:        Constitutional    Fever or chills:     PHYSICAL EXAM:   Vitals:   10/13/19 0913 10/13/19 0915  BP: (!) 173/99 (!) 181/96  Pulse: 62 82  Resp: 18   Temp: 98 F (36.7 C)   TempSrc: Temporal   SpO2: (!) 89%   Weight: 238 lb (108 kg)   Height: 5\' 11"  (1.803 m)     GENERAL: The patient is a well-nourished male, in no acute distress. The vital signs are documented above. CARDIAC: There is a regular rate and rhythm.  VASCULAR: I do not detect carotid bruits. He has palpable femoral and pedal pulses bilaterally. He has no significant lower extremity swelling. PULMONARY: There is good air exchange bilaterally without wheezing or rales. ABDOMEN: Soft and non-tender with normal pitched bowel sounds.  I do not palpate an abdominal aortic aneurysm. MUSCULOSKELETAL: There are no major deformities or cyanosis. NEUROLOGIC: No focal weakness or paresthesias are detected. SKIN: There are no ulcers or rashes noted. PSYCHIATRIC: The patient has a normal affect.  DATA:    MRI: Results of her MRI are discussed above.  She had slow flow or occlusion of the left vertebral artery.  LABS: I have reviewed the labs from 09/28/2019.  Creatinine 0.83.  Hemoglobin 15.7.  Platelets 220,000.  LDL cholesterol 116.  HDL cholesterol 31.  CAROTID DUPLEX: I have independently interpreted his carotid duplex scan today.  On the right side there is a less than 39% stenosis.  The right vertebral artery is patent with antegrade flow.  On the left side there is a less than 39% stenosis.  The left vertebral artery has high resistant flow consistent with a vertebral artery  stenosis.

## 2019-10-20 ENCOUNTER — Other Ambulatory Visit: Payer: Self-pay

## 2019-10-20 ENCOUNTER — Ambulatory Visit (INDEPENDENT_AMBULATORY_CARE_PROVIDER_SITE_OTHER): Payer: Medicare Other

## 2019-10-20 ENCOUNTER — Telehealth: Payer: Self-pay | Admitting: Pulmonary Disease

## 2019-10-20 ENCOUNTER — Telehealth: Payer: Self-pay | Admitting: General Surgery

## 2019-10-20 ENCOUNTER — Encounter: Payer: Self-pay | Admitting: Pulmonary Disease

## 2019-10-20 ENCOUNTER — Ambulatory Visit: Payer: Medicare Other | Admitting: Pulmonary Disease

## 2019-10-20 VITALS — BP 150/82 | HR 93 | Temp 98.1°F | Ht 71.0 in | Wt 236.2 lb

## 2019-10-20 DIAGNOSIS — R053 Chronic cough: Secondary | ICD-10-CM

## 2019-10-20 DIAGNOSIS — J849 Interstitial pulmonary disease, unspecified: Secondary | ICD-10-CM

## 2019-10-20 DIAGNOSIS — R05 Cough: Secondary | ICD-10-CM | POA: Diagnosis not present

## 2019-10-20 DIAGNOSIS — J9601 Acute respiratory failure with hypoxia: Secondary | ICD-10-CM | POA: Diagnosis not present

## 2019-10-20 DIAGNOSIS — R0602 Shortness of breath: Secondary | ICD-10-CM | POA: Diagnosis not present

## 2019-10-20 LAB — CBC WITH DIFFERENTIAL/PLATELET
Basophils Absolute: 0.1 10*3/uL (ref 0.0–0.1)
Basophils Relative: 0.8 % (ref 0.0–3.0)
Eosinophils Absolute: 0.4 10*3/uL (ref 0.0–0.7)
Eosinophils Relative: 3.4 % (ref 0.0–5.0)
HCT: 48.7 % (ref 39.0–52.0)
Hemoglobin: 16.4 g/dL (ref 13.0–17.0)
Lymphocytes Relative: 23.2 % (ref 12.0–46.0)
Lymphs Abs: 2.5 10*3/uL (ref 0.7–4.0)
MCHC: 33.6 g/dL (ref 30.0–36.0)
MCV: 87.8 fl (ref 78.0–100.0)
Monocytes Absolute: 0.8 10*3/uL (ref 0.1–1.0)
Monocytes Relative: 7.2 % (ref 3.0–12.0)
Neutro Abs: 7.1 10*3/uL (ref 1.4–7.7)
Neutrophils Relative %: 65.4 % (ref 43.0–77.0)
Platelets: 237 10*3/uL (ref 150.0–400.0)
RBC: 5.55 Mil/uL (ref 4.22–5.81)
RDW: 13.7 % (ref 11.5–15.5)
WBC: 10.8 10*3/uL — ABNORMAL HIGH (ref 4.0–10.5)

## 2019-10-20 LAB — COMPREHENSIVE METABOLIC PANEL
ALT: 19 U/L (ref 0–53)
AST: 23 U/L (ref 0–37)
Albumin: 3.9 g/dL (ref 3.5–5.2)
Alkaline Phosphatase: 62 U/L (ref 39–117)
BUN: 15 mg/dL (ref 6–23)
CO2: 33 mEq/L — ABNORMAL HIGH (ref 19–32)
Calcium: 10.9 mg/dL — ABNORMAL HIGH (ref 8.4–10.5)
Chloride: 96 mEq/L (ref 96–112)
Creatinine, Ser: 1.18 mg/dL (ref 0.40–1.50)
GFR: 60.95 mL/min (ref >60.00)
Glucose, Bld: 109 mg/dL — ABNORMAL HIGH (ref 70–99)
Potassium: 3.4 mEq/L — ABNORMAL LOW (ref 3.5–5.1)
Sodium: 137 mEq/L (ref 135–145)
Total Bilirubin: 0.6 mg/dL (ref 0.2–1.2)
Total Protein: 8.1 g/dL (ref 6.0–8.3)

## 2019-10-20 MED ORDER — OLMESARTAN MEDOXOMIL 40 MG PO TABS: 40.0000 mg | ORAL_TABLET | Freq: Every day | ORAL | 1 refills | Status: DC

## 2019-10-20 MED ORDER — PREDNISONE 10 MG PO TABS: ORAL_TABLET | ORAL | 0 refills | Status: DC

## 2019-10-20 NOTE — Telephone Encounter (Signed)
Called and spoke with patient's wife,Deanna.  Deanna stated someone had contacted them, and medication has been sent to pharmacy.  Nothing further at this time.

## 2019-10-20 NOTE — Patient Instructions (Addendum)
Start oxygen 2 L at rest and 3 to 4 L on walking -portable oxygen  Schedule high-resolution CT chest at [facility] or [place] Blood work today Covid testing Prednisone 10 mg tabs Take 4 tabs  daily with food x 4 days, then 3 tabs daily x 4 days, then 2 tabs daily x 4 days, then 1 tab daily x4 days then stop. #40  STOP taking lisinopril Take 40 mg benicar instead - Rx x 30 pills x 1 refill

## 2019-10-20 NOTE — Telephone Encounter (Signed)
LVMTCB x 2 for patient on both contact numbers to advise the tank used during his office visit to determine O2 need was not to be taken home as both he and his were told this during the visit. Patient and wife were told he was to only take the nasal cannula since it was already used in office.  Advised in the message left they were to return the tank immediately as it is a clinic tank used for other pulmonary patients and not for them to take home.  I contacted [employer] and spoke with Melissa who advised they were directed not to interfere with the tanks that have been issued to the clinic.   It is possible they are not able to pick up our tank when the tanks for patient use has been delivered.  Advised both Lauren and Daneil Dan of incident.

## 2019-10-20 NOTE — Progress Notes (Signed)
   Subjective:    Patient ID: David Castillo, male    DOB: 19-Feb-1951, 69 y.o.   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  Spirometry showed moderate airway obstruction but Symbicort did not help him much, and stopped  He is accompanied by his wife today who provides most of the history He has been having worsening dyspnea on exertion and dry cough for 2 months.  His blood pressure has been high and has been having intermittent headaches, lisinopril was increased to 40 mg. Oxygen saturation has been running in the 80s, he had office visit with his PCP but for some reason oxygen was not started His saturation was 83% on arrival to the office today and increased to 91% on 2 L. We walked him on 3 L and oxygen saturation stayed above 92%.  Chest x-ray was obtained which showed increased bilateral interstitial infiltrates with new infiltrates in the left midlung compared to 09/28/2019  No pedal edema or orthopnea paroxysmal nocturnal dyspnea    Significant tests/ events reviewed  HRCT 10/21/18 >>" indeterminate ", scattered areas of septal thickening and subpleural reticulation in both lower lobes, stable compared to 10/2017  CT chest 11/19/17 showed mosaic perfusion of the lungs and mild scarring at the bases, 8 mm groundglass opacity right lung apex, ascending thoracic aorta 40 mm  11/2018 ANA neg, CCP neg Spirometry 11/2016 showed ratio of 68 with FEV1 of 72% and FVC 53% that improved 18% with bronchodilators of 63%. FEV1 did not change much. Echo 12/03/17 showed mild LVH, mildly dilated left atrium, grade 1 diastolic dysfunction.  Past Medical History:  Diagnosis Date  . Hypertension   . Shortness of breath   . SOB (shortness of breath)    . Weakness       Review of Systems neg for any significant sore throat, dysphagia, itching, sneezing, nasal congestion or excess/ purulent secretions, fever, chills, sweats, unintended wt loss, pleuritic or exertional cp, hempoptysis, orthopnea pnd or change in chronic leg swelling. Also denies presyncope, palpitations, heartburn, abdominal pain, nausea, vomiting, diarrhea or change in bowel or urinary habits, dysuria,hematuria, rash, arthralgias, visual complaints, headache, numbness weakness or ataxia.     Objective:   Physical Exam   Gen. Pleasant, well-nourished, in no distress, normal affect ENT - no pallor,icterus, no post nasal drip Neck: No JVD, no thyromegaly, no carotid bruits Lungs: no use of accessory muscles, no dullness to percussion, BL 1/3 rales no rhonchi  Cardiovascular: Rhythm regular, heart sounds  normal, no murmurs or gallops, no peripheral edema Abdomen: soft and non-tender, no hepatosplenomegaly, BS normal. Musculoskeletal: No deformities, no cyanosis or clubbing Neuro:  alert, non focal        Assessment & Plan:

## 2019-10-20 NOTE — Assessment & Plan Note (Signed)
Likely related to ILD but to avoid confounding factor we will have him discontinue lisinopril and changed to Benicar 40 mg.

## 2019-10-20 NOTE — Addendum Note (Signed)
Addended by: Nena Polio on: 10/20/2019 04:41 PM   Modules accepted: Orders

## 2019-10-20 NOTE — Assessment & Plan Note (Addendum)
Unclear if hypoxia is acute or chronic now for 2 months. Per wife's description this has been going on for at least the past few weeks -and does account for some of his symptoms of lethargy and weakness. We will start him on 2 L of oxygen at rest and 3 L on exertion. Emphasized oxygen use 24/7  At significant risk of hospitalization or worsening

## 2019-10-20 NOTE — Assessment & Plan Note (Signed)
Infiltrates appear to be much worse compared to 2020 , unclear if this is a flareup of his chronic ILD or acute superimposed process.  He does not seem to have fevers or purulent sputum to indicate an acute infection Due to the pandemic, Covid testing would be appropriate. We will also proceed with high-resolution CT and get basic serology including ENA &  CCP He does not seem to have any stigmata of connective tissue disease.  We will discuss antifibrotic's of CT seems to suggest IPF. IPF flare could possibly present in this manner -I will give him a course of prednisone while awaiting studies

## 2019-10-20 NOTE — Telephone Encounter (Signed)
Received message from [employer] that the tank was returned by patient/spouse. Nothing further needed at this time.

## 2019-10-21 DIAGNOSIS — R05 Cough: Secondary | ICD-10-CM | POA: Diagnosis not present

## 2019-10-21 DIAGNOSIS — R0602 Shortness of breath: Secondary | ICD-10-CM | POA: Diagnosis not present

## 2019-10-21 DIAGNOSIS — J849 Interstitial pulmonary disease, unspecified: Secondary | ICD-10-CM | POA: Diagnosis not present

## 2019-10-21 LAB — ANA+ENA+DNA/DS+SCL 70+SJOSSA/B
ANA Titer 1: NEGATIVE
ENA RNP Ab: <0.2 AI (ref 0.0–0.9)
ENA SM Ab Ser-aCnc: <0.2 AI (ref 0.0–0.9)
ENA SSA (RO) Ab: 0.2 AI (ref 0.0–0.9)
ENA SSB (LA) Ab: <0.2 AI (ref 0.0–0.9)
Scleroderma (Scl-70) (ENA) Antibody, IgG: <0.2 AI (ref 0.0–0.9)
dsDNA Ab: 1 IU/mL (ref 0–9)

## 2019-10-22 ENCOUNTER — Telehealth: Payer: Self-pay | Admitting: Pulmonary Disease

## 2019-10-22 NOTE — Telephone Encounter (Signed)
CXR report from 09/28/19 [place] >>no infiltrate Seems to be acute process Please ask him to undergo COVID testing

## 2019-10-24 NOTE — Telephone Encounter (Signed)
lmtcb for pt. Routing to [place] since this is not triage-generated.

## 2019-10-25 NOTE — Telephone Encounter (Signed)
Dr. Elsworth Soho,   I called and spoke to patient spouse Deanna. She stated the patient just woke up and asked if the information can be given to her. I advised her of the information noted below and that you wanted him to be tested for covid.  She stated he was tested by his PCP on 10/20/19 for covid and it came back negative.  I asked her to contact PCP office to have them fax the result information and office/lab notes. She stated she request for the information to be sent.

## 2019-10-26 NOTE — Telephone Encounter (Signed)
Fax from [place] received. Placed on Dr. Bari Mantis desk for review.

## 2019-11-01 ENCOUNTER — Other Ambulatory Visit: Payer: Self-pay

## 2019-11-01 ENCOUNTER — Ambulatory Visit (INDEPENDENT_AMBULATORY_CARE_PROVIDER_SITE_OTHER)
Admission: RE | Admit: 2019-11-01 | Discharge: 2019-11-01 | Disposition: A | Discharge status: Home / Self Care | Payer: Medicare Other | Source: Ambulatory Visit | Attending: Pulmonary Disease | Admitting: Pulmonary Disease

## 2019-11-01 DIAGNOSIS — R0602 Shortness of breath: Secondary | ICD-10-CM | POA: Diagnosis not present

## 2019-11-01 DIAGNOSIS — J849 Interstitial pulmonary disease, unspecified: Secondary | ICD-10-CM

## 2019-11-03 ENCOUNTER — Other Ambulatory Visit: Payer: Self-pay

## 2019-11-03 ENCOUNTER — Ambulatory Visit: Payer: Medicare Other | Admitting: Pulmonary Disease

## 2019-11-03 ENCOUNTER — Encounter: Payer: Self-pay | Admitting: Pulmonary Disease

## 2019-11-03 VITALS — BP 138/106 | HR 61 | Ht 71.0 in | Wt 238.0 lb

## 2019-11-03 DIAGNOSIS — J849 Interstitial pulmonary disease, unspecified: Secondary | ICD-10-CM | POA: Diagnosis not present

## 2019-11-03 DIAGNOSIS — J9601 Acute respiratory failure with hypoxia: Secondary | ICD-10-CM

## 2019-11-03 MED ORDER — PREDNISONE 10 MG PO TABS: ORAL_TABLET | ORAL | 0 refills | Status: AC

## 2019-11-03 NOTE — Assessment & Plan Note (Signed)
Improved oxygen levels on walk today status post prednisone course Walk today in office patient completed 2 laps without any oxygen desaturation  Plan: We will continue to clinically monitor Prednisone taper today for 4 weeks Repeat pulmonary function testing Follow-up with our office in 4 weeks

## 2019-11-03 NOTE — Progress Notes (Signed)
@Patient  ID: David Castillo, male    DOB: 02-Jun-1951, 69 y.o.   MRN: [id number]  Chief Complaint  Patient presents with   Follow-up    Patient is here for follow up for oxygen. Patient and wife states that he is doing better since starting. Patient wears oxygen all the time. Patient had a CT done yesterday is here to talk about results.     Referring provider: Rory Percy, MD  HPI:  69 year old male former smoker followed in our office for COPD and interstitial lung disease  PMH: Hard of hearing, hypertension Smoker/ Smoking History: Former smoker.  Quit 1978.  20-pack-year smoking history. Maintenance: Breo Ellipta 100 Pt of: Dr. Elsworth Soho  Patient is a truck driver.  Limited exposure history.  He does have known exposure to diesel engines as well as diesel engines in enclosed area.  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  Last office visit reviewed 11/2018 -  Felt to have mild ILD, unchanged compared to 2019 .He did not want to repeat PFTs. Spirometry showed moderate airway obstruction but Symbicort did not help him much, and stopped  He is accompanied by his wife today who provides most of the history He has been having worsening dyspnea on exertion and dry cough for 2 months.  His blood pressure has been high and has been having intermittent headaches, lisinopril was increased to 40 mg. Oxygen saturation has been running in the 80s, he had office visit with his PCP but for some reason oxygen was not started His saturation was 83% on arrival to the office today and increased to 91% on 2 L. We walked him on 3 L and oxygen saturation stayed above 92%.    11/03/2019  - Visit   69 year old male former smoker presenting to office today as a follow-up visit from his 10/20/2019 office visit with Dr. Elsworth Soho.  At that office visit patient had lab work to check for connective tissue disorders.  His previous connective tissue lab work had  been negative.  Patient also had a high-resolution CT chest ordered.   Connective tissue lab work from last office visit was negative.  High-resolution CT chest that was ordered does show slight progression.  Still favors indeterminate for UIP or NSIP.  There is air trapping.  Does not appear to be sarcoidosis.  Patient reports clinical improvement since being treated with short term prednisone taper.  He has 2 days left.  He was started back on oxygen at last office visit.  He presents today on room air.  Walk today in office does not reveal any oxygen desaturations.  Dr. Elsworth Soho after seeing the patient in January/2021 reviewed patient's chest x-ray results from urgent care when he received the images.  After reviewing the images it looks like patient's interstitial changes may have been more acute than initially suspected.  I do have wanted the patient to obtain Covid testing.  This was not completed.  Patient and spouse both have questions regarding the current work-up as they are concerned regarding the patient's oxygen needs.  Patient is eager to go back to work.  They are unsure exactly what he was diagnosed with.  We will further evaluate and discuss this today.  Tests:   10/20/2019-ANA-negative 10/20/2019-connective tissue serology-negative  11/02/2019-CT chest high-res-slightly progressive pattern fibrosis from 10/21/2018 may be due to nonspecific interstitial pneumonitis or UIP.  Findings are indeterminate for UIP per consensus guidelines, emphysema  HRCT 10/21/18 >>" indeterminate ",  scattered areas of septal thickening and subpleural reticulation in both lower lobes, stable compared to 10/2017  CT chest 11/19/17 showed mosaic perfusion of the lungs and mild scarring at the bases, 8 mm groundglass opacity right lung apex, ascending thoracic aorta 40 mm  11/2018 ANA neg, CCP neg Spirometry 11/2016 showed ratio of 68 with FEV1 of 72% and FVC 53% that improved 18% with bronchodilators of 63%.  FEV1 did not change much. Echo 12/03/17 showed mild LVH, mildly dilated left atrium, grade 1 diastolic dysfunction.  FENO:  No results found for: NITRICOXIDE  PFT: No flowsheet data found.  WALK:  SIX MIN WALK 11/03/2019 10/20/2019  Supplimental Oxygen during Test? (L/min) No Yes  Tech Comments: Patient was able to walk to a steady pace. Was able to complete 2 laps without oxygen. Denied any SOB, chest pain or leg pain after walk. Patient was able to maintain a steady walking pace while on 3L continuous. Patient did not have to sit or stop at any time.    Imaging: DG Chest 2 View  Result Date: 10/20/2019 CLINICAL DATA:  Interstitial lung disease, worsening shortness of breath EXAM: CHEST - 2 VIEW COMPARISON:  09/28/2019 FINDINGS: Enlargement of cardiac silhouette. Mediastinal contours and pulmonary vascularity normal. Chronic interstitial changes in both lungs. Question new superimposed infiltrates LEFT mid lung and at LEFT costophrenic angle. Remaining lungs clear. No pleural effusion or pneumothorax. IMPRESSION: Chronic greater sister lung disease changes. Suspect superimposed acute interstitial infiltrates at the mid and lower LEFT lung. Electronically Signed   By: Lavonia Dana M.D.   On: 10/20/2019 12:31   CT Chest High Resolution  Result Date: 11/02/2019 CLINICAL DATA:  Chronic shortness of breath with exertion. EXAM: CT CHEST WITHOUT CONTRAST TECHNIQUE: Multidetector CT imaging of the chest was performed following the standard protocol without intravenous contrast. High resolution imaging of the lungs, as well as inspiratory and expiratory imaging, was performed. COMPARISON:  10/21/2018, 11/19/2017. FINDINGS: Cardiovascular: Atherosclerotic calcification of the aorta, aortic valve and coronary arteries. Heart size within normal limits. No pericardial effusion. Mediastinum/Nodes: Left lobe of the thyroid is asymmetrically enlarged and contains a 1.5 cm low-density nodule in the lower pole  (2/10), similar. No pathologically enlarged mediastinal or axillary lymph nodes. There are calcified subcarinal and right hilar lymph nodes. Esophagus is grossly unremarkable. Lungs/Pleura: Centrilobular emphysema. Peripheral and basilar predominant subpleural reticulation and ground-glass with traction bronchiolectasis. Findings appear slightly progressive from 10/21/2018. The possibility of superimposed dependent atelectasis in both lower lobes is considered. 5 mm apical segment right upper lobe nodule (3/16), similar to minimally smaller. Calcified granulomas. No honeycombing. There is air trapping. No pleural fluid. Airway is unremarkable. Upper Abdomen: Visualized portions of the liver, gallbladder, adrenal glands, kidneys, spleen, pancreas stomach and bowel are unremarkable. No upper abdominal adenopathy. Musculoskeletal: Degenerative changes in the spine. No worrisome lytic or sclerotic lesions. IMPRESSION: 1. Pulmonary parenchymal pattern of fibrosis appears slightly progressive from 10/21/2018 and may be due to nonspecific interstitial pneumonitis or usual interstitial pneumonitis. Findings are indeterminate for UIP per consensus guidelines: Diagnosis of Idiopathic Pulmonary Fibrosis: An Official ATS/ERS/JRS/ALAT Clinical Practice Guideline. [place], Iss 5, [phone], May 23 2017. 2. 5 mm apical segment right upper lobe nodule, similar to minimally smaller. 3. Air trapping is indicative of small airways disease. 4. Asymmetric enlargement of the left thyroid with a 1.5 cm lower pole nodule. Recommend thyroid US (ref: J Am Coll Radiol. 2015 Feb;12(2): 143-50). 5. Aortic atherosclerosis (ICD10-I70.0). Coronary artery calcification.  6.  Emphysema (ICD10-J43.9). Electronically Signed   By: Lorin Picket M.D.   On: 11/02/2019 09:18   VAS US CAROTID  Result Date: 10/13/2019 Carotid Arterial Duplex Study Indications:       Carotid artery disease. Comparison Study:  No prior carotid  duplex . Slow flow / occlusion in the left                    vertebal artery per MRI 10/04/2019 Performing Technologist: Alvia Grove RVT  Examination Guidelines: A complete evaluation includes B-mode imaging, spectral Doppler, color Doppler, and power Doppler as needed of all accessible portions of each vessel. Bilateral testing is considered an integral part of a complete examination. Limited examinations for reoccurring indications may be performed as noted.  Right Carotid Findings: +----------+--------+--------+--------+------------------+--------+             PSV cm/s EDV cm/s Stenosis Plaque Description Comments  +----------+--------+--------+--------+------------------+--------+  CCA Prox   72       12                                             +----------+--------+--------+--------+------------------+--------+  CCA Mid    78       13                                             +----------+--------+--------+--------+------------------+--------+  CCA Distal 81       20                                             +----------+--------+--------+--------+------------------+--------+  ICA Prox   71       22                heterogenous                 +----------+--------+--------+--------+------------------+--------+  ICA Mid    80       27                                             +----------+--------+--------+--------+------------------+--------+  ICA Distal 71       21                                             +----------+--------+--------+--------+------------------+--------+  ECA        141      20                                             +----------+--------+--------+--------+------------------+--------+ +----------+--------+-------+----------------+-------------------+             PSV cm/s EDV cms Describe         Arm Pressure (mmHG)  +----------+--------+-------+----------------+-------------------+  Subclavian 75       2  Multiphasic, WNL                       +----------+--------+-------+----------------+-------------------+ +---------+--------+--+--------+--+---------+  Vertebral PSV cm/s 63 EDV cm/s 22 Antegrade  +---------+--------+--+--------+--+---------+  Left Carotid Findings: +----------+--------+--------+--------+------------------+--------+             PSV cm/s EDV cm/s Stenosis Plaque Description Comments  +----------+--------+--------+--------+------------------+--------+  CCA Prox   98       21                                             +----------+--------+--------+--------+------------------+--------+  CCA Mid    83       20                                             +----------+--------+--------+--------+------------------+--------+  CCA Distal 66       21                                             +----------+--------+--------+--------+------------------+--------+  ICA Prox   70       20                heterogenous                 +----------+--------+--------+--------+------------------+--------+  ICA Mid    56       18                                             +----------+--------+--------+--------+------------------+--------+  ICA Distal 70       15                                             +----------+--------+--------+--------+------------------+--------+  ECA        134      23                                             +----------+--------+--------+--------+------------------+--------+ +----------+--------+--------+----------------+-------------------+             PSV cm/s EDV cm/s Describe         Arm Pressure (mmHG)  +----------+--------+--------+----------------+-------------------+  Subclavian 75       2        Multiphasic, WNL                      +----------+--------+--------+----------------+-------------------+ +---------+--------+--+--------+-+----------------------------+  Vertebral PSV cm/s 52 EDV cm/s 4 Antegrade and High resistant  +---------+--------+--+--------+-+----------------------------+   Summary: Right Carotid:  Velocities in the right ICA are consistent with a 1-39% stenosis. Left Carotid: Velocities in the left ICA are consistent with a 1-39% stenosis. Vertebrals:  Right vertebral artery demonstrates antegrade flow. Left vertebral              artery  demonstrates high resistant antegrade flow. Subclavians: Normal flow hemodynamics were seen in bilateral subclavian              arteries. *See table(s) above for measurements and observations.  Electronically signed by Deitra Mayo MD on 10/13/2019 at 9:58:08 AM.    Final     Lab Results:  CBC    Component Value Date/Time   WBC 10.8 (H) 10/20/2019 1256   RBC 5.55 10/20/2019 1256   HGB 16.4 10/20/2019 1256   HCT 48.7 10/20/2019 1256   PLT 237.0 10/20/2019 1256   MCV 87.8 10/20/2019 1256   MCH 30.2 12/01/2018 1606   MCHC 33.6 10/20/2019 1256   RDW 13.7 10/20/2019 1256   LYMPHSABS 2.5 10/20/2019 1256   MONOABS 0.8 10/20/2019 1256   EOSABS 0.4 10/20/2019 1256   BASOSABS 0.1 10/20/2019 1256    BMET    Component Value Date/Time   NA 137 10/20/2019 1256   K 3.4 (L) 10/20/2019 1256   CL 96 10/20/2019 1256   CO2 33 (H) 10/20/2019 1256   GLUCOSE 109 (H) 10/20/2019 1256   BUN 15 10/20/2019 1256   CREATININE 1.18 10/20/2019 1256   CREATININE 0.88 12/01/2018 1606   CALCIUM 10.9 (H) 10/20/2019 1256    BNP No results found for: BNP  ProBNP No results found for: PROBNP  Specialty Problems      Pulmonary Problems   Shortness of breath   Chronic cough   ILD (interstitial lung disease) (HCC)   Acute hypoxemic respiratory failure (HCC)      Allergies  Allergen Reactions   Penicillins Anaphylaxis    Per patient, this is a childhood allergy.    Penicillins      There is no immunization history on file for this patient.  Past Medical History:  Diagnosis Date   Hypertension    Shortness of breath    SOB (shortness of breath)    Weakness     Tobacco History: Social History   Tobacco Use  Smoking Status Former  Smoker   Quit date: 09/22/1976   Years since quitting: 43.1  Smokeless Tobacco Never Used   Counseling given: Not Answered   Continue to not smoke  Outpatient Encounter Medications as of 11/03/2019  Medication Sig   hydrochlorothiazide (HYDRODIURIL) 12.5 MG tablet Take 1 tablet by mouth daily.   olmesartan (BENICAR) 40 MG tablet Take 1 tablet (40 mg total) by mouth daily.   predniSONE (DELTASONE) 10 MG tablet Take 4 tabs daily w/food x 4 days, then 3 tabs daily x 4 days, then 2 tabs daily x 4 days, then 1 tab daily x4 days then stop.   albuterol (PROAIR HFA) 108 (90 Base) MCG/ACT inhaler Inhale 2 puffs into the lungs every 4 (four) hours as needed for wheezing or shortness of breath. (Patient not taking: Reported on 10/20/2019)   BREO ELLIPTA 100-25 MCG/INH AEPB Inhale 1 puff into the lungs daily.   predniSONE (DELTASONE) 10 MG tablet Take 2 tablets (20 mg total) by mouth daily with breakfast for 7 days, THEN 1.5 tablets (15 mg total) daily with breakfast for 7 days, THEN 1 tablet (10 mg total) daily with breakfast for 7 days, THEN 0.5 tablets (5 mg total) daily with breakfast for 7 days.   traMADol (ULTRAM) 50 MG tablet Take 1-2 tablets by mouth every 8 (eight) hours as needed for pain.   No facility-administered encounter medications on file as of 11/03/2019.     Review of Systems  Review of Systems  Constitutional: Negative for activity change, chills, fatigue, fever and unexpected weight change.  HENT: Positive for hearing loss. Negative for postnasal drip, rhinorrhea, sinus pressure, sinus pain and sore throat.   Eyes: Negative.   Respiratory: Positive for shortness of breath (Improved). Negative for cough and wheezing.   Cardiovascular: Negative for chest pain and palpitations.  Gastrointestinal: Negative for constipation, diarrhea, nausea and vomiting.  Endocrine: Negative.   Genitourinary: Negative.   Musculoskeletal: Negative.   Skin: Negative.   Neurological:  Negative for dizziness and headaches.  Psychiatric/Behavioral: Negative.  Negative for dysphoric mood. The patient is not nervous/anxious.   All other systems reviewed and are negative.    Physical Exam  BP (!) 138/106 (BP Location: Left Arm, Patient Position: Sitting, Cuff Size: Normal)    Pulse 61    Ht 5\' 11"  (1.803 m)    Wt 238 lb (108 kg)    SpO2 92%    BMI 33.19 kg/m   Wt Readings from Last 5 Encounters:  11/03/19 238 lb (108 kg)  10/20/19 236 lb 3.2 oz (107.1 kg)  10/13/19 238 lb (108 kg)  12/01/18 240 lb (108.9 kg)  02/02/18 228 lb (103.4 kg)    BMI Readings from Last 5 Encounters:  11/03/19 33.19 kg/m  10/20/19 32.94 kg/m  10/13/19 33.19 kg/m  12/01/18 32.55 kg/m  02/02/18 30.92 kg/m     Physical Exam Vitals and nursing note reviewed.  Constitutional:      General: He is not in acute distress.    Appearance: Normal appearance. He is obese.  HENT:     Head: Normocephalic and atraumatic.     Right Ear: Hearing, tympanic membrane, ear canal and external ear normal.     Left Ear: Hearing, tympanic membrane, ear canal and external ear normal.     Ears:     Comments: Hard of hearing    Nose: Nose normal. No mucosal edema or rhinorrhea.     Right Turbinates: Not enlarged.     Left Turbinates: Not enlarged.     Mouth/Throat:     Mouth: Mucous membranes are dry.     Pharynx: Oropharynx is clear. No oropharyngeal exudate.  Eyes:     Pupils: Pupils are equal, round, and reactive to light.  Cardiovascular:     Rate and Rhythm: Normal rate and regular rhythm.     Pulses: Normal pulses.     Heart sounds: Normal heart sounds. No murmur.  Pulmonary:     Effort: Pulmonary effort is normal.     Breath sounds: Rales (Basilar crackles, right greater than left) present. No decreased breath sounds or wheezing.  Abdominal:     General: Bowel sounds are normal. There is no distension.     Palpations: Abdomen is soft.     Tenderness: There is no abdominal tenderness.    Musculoskeletal:     Cervical back: Normal range of motion.     Right lower leg: No edema.     Left lower leg: No edema.  Lymphadenopathy:     Cervical: No cervical adenopathy.  Skin:    General: Skin is warm and dry.     Capillary Refill: Capillary refill takes less than 2 seconds.     Findings: No erythema or rash.  Neurological:     General: No focal deficit present.     Mental Status: He is alert and oriented to person, place, and time.     Motor: No weakness.     Coordination: Coordination normal.  Gait: Gait is intact. Gait (Completed 2 laps in office) normal.  Psychiatric:        Mood and Affect: Mood normal.        Behavior: Behavior normal. Behavior is cooperative.        Thought Content: Thought content normal.        Judgment: Judgment normal.       Assessment & Plan:   ILD (interstitial lung disease) ([place]) Repeat high-resolution CT chest shows slight progression from previous scan Connective tissue serologies negative again NSIP versus UIP, with concern of recent ILD flare Doubt hypersensitivity pneumonitis, I do not believe a panel is indicated at this time Walk today in office shows improved oxygen saturation as well as clinical improvement status post prednisone Last pulmonary function testing was 2019  Discussion: Contacted Dr. Elsworth Soho to discuss case.  Reviewed with Dr. Elsworth Soho that walk today in office patient had no oxygen desaturations.  This is a improvement.  Based off of this we will proceed forward with a longer prednisone course.  Dr. Elsworth Soho agrees with repeating pulmonary function testing.  Plan: We will coordinate pulmonary function test in our office to further evaluate patient's breathing as well as to compare to 2019 results 4-week prednisone taper 20 mg daily for 7 days, 15 mg daily for 7 days, 10 mg daily for 7 days, 5 mg daily for 7 days, then stop Patient to follow-up with Dr. Elsworth Soho in March/2021 Can consider antifibrotic's as well as  potentially additional ILD work-up status post pulmonary function testing At next office visit can consider ILD packet to help with further history taking as well as potential environmental exposures  Acute hypoxemic respiratory failure (HCC) Improved oxygen levels on walk today status post prednisone course Walk today in office patient completed 2 laps without any oxygen desaturation  Plan: We will continue to clinically monitor Prednisone taper today for 4 weeks Repeat pulmonary function testing Follow-up with our office in 4 weeks    Return in about 4 weeks (around 12/01/2019), or if symptoms worsen or fail to improve, for Follow up with Dr. Elsworth Soho, Follow up for PFT.   Lauraine Rinne, NP 11/03/2019   This appointment required 43 minutes of patient care (this includes precharting, chart review, review of results, face-to-face care, etc.).

## 2019-11-03 NOTE — Patient Instructions (Addendum)
You were seen today by Lauraine Rinne, NP  for:   1. ILD (interstitial lung disease) ([place])  - Pulmonary function test; Future  You did well on your walk today  I have extended your prednisone today after speaking with Dr. Elsworth Soho.  We will order your breathing test and coordinate to get that scheduled.  Dr. Elsworth Soho will see you back in office in March/2021   We recommend today:  Orders Placed This Encounter  Procedures  . Pulmonary function test    Standing Status:   Future    Standing Expiration Date:   11/02/2020    Order Specific Question:   Where should this test be performed?    Answer:   [facility]    Order Specific Question:   Full PFT: includes the following: basic spirometry, spirometry pre & post bronchodilator, diffusion capacity (DLCO), lung volumes    Answer:   Full PFT   Orders Placed This Encounter  Procedures  . Pulmonary function test   Meds ordered this encounter  Medications  . predniSONE (DELTASONE) 10 MG tablet    Sig: Take 2 tablets (20 mg total) by mouth daily with breakfast for 7 days, THEN 1.5 tablets (15 mg total) daily with breakfast for 7 days, THEN 1 tablet (10 mg total) daily with breakfast for 7 days, THEN 0.5 tablets (5 mg total) daily with breakfast for 7 days.    Dispense:  35 tablet    Refill:  0    Follow Up:    Return in about 4 weeks (around 12/01/2019), or if symptoms worsen or fail to improve, for Follow up with Dr. Elsworth Soho, Follow up for PFT.   Please do your part to reduce the spread of COVID-19:      Reduce your risk of any infection  and COVID19 by using the similar precautions used for avoiding the common cold or flu:  Marland Kitchen Wash your hands often with soap and warm water for at least 20 seconds.  If soap and water are not readily available, use an alcohol-based hand sanitizer with at least 60% alcohol.  . If coughing or sneezing, cover your mouth and nose by coughing or sneezing into the elbow areas of your shirt or coat, into a  tissue or into your sleeve (not your hands). Langley Gauss A MASK when in public  . Avoid shaking hands with others and consider head nods or verbal greetings only. . Avoid touching your eyes, nose, or mouth with unwashed hands.  . Avoid close contact with people who are sick. . Avoid places or events with large numbers of people in one location, like concerts or sporting events. . If you have some symptoms but not all symptoms, continue to monitor at home and seek medical attention if your symptoms worsen. . If you are having a medical emergency, call 911.   [place] / e-Visit: [email or web address]         [facility]: [facility]: [id number]                   [facility]: [id number]     It is flu season:   >>> Best ways to protect herself from the flu: Receive the yearly flu vaccine, practice good hand hygiene washing with soap and also using hand sanitizer when available, eat a nutritious meals, get adequate rest, hydrate appropriately   Please contact the office if your  symptoms worsen or you have concerns that you are not improving.   Thank you for choosing [facility] for your healthcare, and for allowing [place] to partner with you on your healthcare journey. I am thankful to be able to provide care to you today.   Wyn Quaker FNP-C

## 2019-11-03 NOTE — Assessment & Plan Note (Addendum)
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 Elsworth Soho to discuss case.  Reviewed with Dr. Elsworth Soho that walk today in office patient had no oxygen desaturations.  This is a improvement.  Based off of this we will proceed forward with a longer prednisone course.  Dr. Elsworth Soho agrees with repeating pulmonary function testing.  Plan: We will coordinate pulmonary function test in our office to further evaluate patient's breathing as well as to compare to 2019 results 4-week prednisone taper 20 mg daily for 7 days, 15 mg daily for 7 days, 10 mg daily for 7 days, 5 mg daily for 7 days, then stop Patient to follow-up with Dr. Elsworth Soho in March/2021 Can consider antifibrotic's as well as potentially additional ILD work-up status post pulmonary function testing At next office visit can consider ILD packet to help with further history taking as well as potential environmental exposures

## 2019-11-14 ENCOUNTER — Inpatient Hospital Stay (HOSPITAL_COMMUNITY): Admission: RE | Admit: 2019-11-14 | Payer: Medicare Other | Source: Ambulatory Visit

## 2019-11-20 DIAGNOSIS — R05 Cough: Secondary | ICD-10-CM | POA: Diagnosis not present

## 2019-11-20 DIAGNOSIS — R0602 Shortness of breath: Secondary | ICD-10-CM | POA: Diagnosis not present

## 2019-11-20 DIAGNOSIS — J849 Interstitial pulmonary disease, unspecified: Secondary | ICD-10-CM | POA: Diagnosis not present

## 2019-11-22 ENCOUNTER — Other Ambulatory Visit: Payer: Self-pay | Admitting: Pulmonary Disease

## 2019-11-22 DIAGNOSIS — J9601 Acute respiratory failure with hypoxia: Secondary | ICD-10-CM

## 2019-11-22 DIAGNOSIS — R05 Cough: Secondary | ICD-10-CM

## 2019-11-22 DIAGNOSIS — R053 Chronic cough: Secondary | ICD-10-CM

## 2019-11-22 DIAGNOSIS — J849 Interstitial pulmonary disease, unspecified: Secondary | ICD-10-CM

## 2019-11-22 MED ORDER — OLMESARTAN MEDOXOMIL 40 MG PO TABS: 40.0000 mg | ORAL_TABLET | Freq: Every day | ORAL | 0 refills | Status: DC

## 2019-11-24 ENCOUNTER — Telehealth: Payer: Self-pay | Admitting: Pulmonary Disease

## 2019-11-24 NOTE — Telephone Encounter (Signed)
11/24/2019  Per chart review it looks like patient has not yet been scheduled for follow-up with Dr. Elsworth Soho.  We see that there is a recall in.  Patient does need to be rescheduled for his pulmonary function test.  Unfortunately looks like this was canceled.  This is a very important aspect of his work-up for his shortness of breath.  Wyn Quaker, FNP

## 2019-11-24 NOTE — Telephone Encounter (Signed)
Spoke with pt's wife and gave message from [place]. She scheduled PFT for 02/02/2020 at 4:00 pm and Covid test on 01/30/2020 at 1:00pm. Dr. Elsworth Soho schedule is not out that far but wife agreed to call back for an appointment or wait for the recall. She is ok with coming in two separate trips if needed for PFT and OV with [facility]. Martie Lee.

## 2019-11-24 NOTE — Telephone Encounter (Signed)
Thank you.   Brian Mack FNP

## 2019-11-29 ENCOUNTER — Ambulatory Visit (INDEPENDENT_AMBULATORY_CARE_PROVIDER_SITE_OTHER): Payer: Medicare Other | Admitting: Internal Medicine

## 2019-11-29 DIAGNOSIS — H8110 Benign paroxysmal vertigo, unspecified ear: Secondary | ICD-10-CM | POA: Diagnosis not present

## 2019-12-02 ENCOUNTER — Other Ambulatory Visit: Payer: Self-pay | Admitting: Pulmonary Disease

## 2019-12-02 DIAGNOSIS — J9601 Acute respiratory failure with hypoxia: Secondary | ICD-10-CM

## 2019-12-02 DIAGNOSIS — R053 Chronic cough: Secondary | ICD-10-CM

## 2019-12-02 DIAGNOSIS — J849 Interstitial pulmonary disease, unspecified: Secondary | ICD-10-CM

## 2019-12-02 DIAGNOSIS — R05 Cough: Secondary | ICD-10-CM

## 2019-12-19 DIAGNOSIS — R0602 Shortness of breath: Secondary | ICD-10-CM | POA: Diagnosis not present

## 2019-12-19 DIAGNOSIS — J849 Interstitial pulmonary disease, unspecified: Secondary | ICD-10-CM | POA: Diagnosis not present

## 2019-12-19 DIAGNOSIS — R05 Cough: Secondary | ICD-10-CM | POA: Diagnosis not present

## 2019-12-20 ENCOUNTER — Telehealth: Payer: Self-pay

## 2019-12-20 NOTE — Telephone Encounter (Signed)
-----   Message from Rigoberto Noel, MD sent at 12/19/2019 11:27 AM EDT ----- Can he get sooner PFT and follow-up appointment with me please?  RA ----- Message ----- From: Lauraine Rinne, NP Sent: 11/03/2019   6:00 PM EDT To: Rigoberto Noel, MD

## 2019-12-20 NOTE — Telephone Encounter (Signed)
Called and spoke with wife Deanna per patients DPR. We can get patient in sooner for PFT and office visit. Wife was at the vet and stated she would have to call us back to get him scheduled. Will wait to hear back from her.

## 2020-01-02 NOTE — Progress Notes (Signed)
Interstitial Lung Disease Multidisciplinary Conference   David Castillo    MRN [id number]    DOB 07/23/1951  Primary Care Physician:Howard, Lennette Bihari, MD  Referring Physician:  Dr Elsworth Soho  Time of Conference: 7.30am- 8.30am Date of conference: 11/29/19  Location of Conference: -  Virtual  Participating Pulmonary: Dr. Brand Males, MD - yes,  Dr Marshell Garfinkel, MD - yes Pathology: Dr Jaquita Folds, MD - yes , Dr Enid Cutter - no Radiology: Dr Salvatore Marvel MD - no, Dr Vinnie Langton MD - yes,  Dr Lorin Picket, MD - no , Dr Eddie Candle - no Others: Dr Elsworth Soho  Brief History: Dr Elsworth Soho prsented . Reported -> patient does not want bx, does not want anti fibrotic Rx (in the past) ) but came back 2y later in jan 2021 with acute symptoms.Serology neg, CVD stigmata - negative.  In Jan 2021 CXR had diffuse infiltates -> Rx steroids and responded. Initialy clinical impression was covid Jan 28 that was different and radiologiucally looked covid but tested negative. Came to office in wheel chair and ramatically improved wih CXR. Clinically Rx with steroids a bit longer.       Serology:  Results for David Castillo (MRN [id number]) as of 01/02/2020 08:27  Ref. Range 12/01/2018 16:06 10/20/2019 12:56  Anti Nuclear Antibody (ANA) Latest Ref Range: NEGATIVE  NEGATIVE   ANA Titer 1 Unknown  Negative  Cyclic Citrullin Peptide Ab Latest Units: UNITS <16   dsDNA Ab Latest Ref Range: 0 - 9 IU/mL  1  ENA RNP Ab Latest Ref Range: 0.0 - 0.9 AI  <0.2  ENA SSA (RO) Ab Latest Ref Range: 0.0 - 0.9 AI  0.2  ENA SSB (LA) Ab Latest Ref Range: 0.0 - 0.9 AI  <0.2  ENA SM Ab Ser-aCnc Latest Ref Range: 0.0 - 0.9 AI  <0.2  Scleroderma (Scl-70) (ENA) Antibody, IgG Latest Ref Range: 0.0 - 0.9 AI  <0.2    MDD discussion of CT scan   From feb 2021 - >  Radiology impression -> 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 No HC.   - Date or time period of scan: feb 20921    - What is the final conclusion per  2018 ATS/Fleischner Criteria - indeterminate per official read in feb 2021 but conference read is probabel UIP  Pathology discussion of biopsy no pat:  PFTs: last in mar 2019  Labs: x  MDD Impression/Recs:  MDD ercomendation: settle from steroids and redefine his ILD. He might have 2 process. Plan - 3 month followup with HRCT/PFT and decie on bx - low threshold.     Time Spent in preparation and discussion:  > 30 min    SIGNATURE   Dr. Brand Males, M.D., F.C.C.P,  Pulmonary and Critical Care Medicine Staff Physician, [place] Director - Interstitial Lung Disease  Program  [facility] [place] [facility] [place], [place], [address]  Pager: [phone], If no answer or between  15:00h - 7:00h: call [phone] Telephone: [phone]  8:26 AM 01/02/2020 ...................................................................................................................Marland Kitchen References: Diagnosis of Hypersensitivity Pneumonitis in Adults. An Official ATS/JRS/ALAT Clinical Practice Guideline. Ragu G et al, [place] Aug 1;202(3):e36-e69.       Diagnosis of Idiopathic Pulmonary Fibrosis. An Official ATS/ERS/JRS/ALAT Clinical Practice Guideline. Raghu G et al, [place]. 2018 Sep 1;198(5):e44-e68.   IPF Suspected   Histopath ology  Pattern      UIP  Probable UIP  Indeterminate for  UIP  Alternative  diagnosis    UIP  IPF  IPF  IPF  Non-IPF dx   HRCT   Probabe UIP  IPF  IPF  IPF (Likely)**  Non-IPF dx  Pattern  Indeterminate for UIP  IPF  IPF (Likely)**  Indeterminate  for IPF**  Non-IPF dx    Alternative diagnosis  IPF (Likely)**/ non-IPF dx  Non-IPF dx  Non-IPF dx  Non-IPF dx     Idiopathic pulmonary fibrosis diagnosis based upon HRCT and Biopsy paterns.  ** IPF is the likely diagnosis when any of following features are  present:  . Moderate-to-severe traction bronchiectasis/bronchiolectasis (defined as mild traction bronchiectasis/bronchiolectasis in four or more lobes including the lingual as a lobe, or moderate to severe traction bronchiectasis in two or more lobes) in a man over age 6 years or in a woman over age 58 years . Extensive (>30%) reticulation on HRCT and an age >70 years  . Increased neutrophils and/or absence of lymphocytosis in BAL fluid  . Multidisciplinary discussion reaches a confident diagnosis of IPF.   **Indeterminate for IPF  . Without an adequate biopsy is unlikely to be IPF  . With an adequate biopsy may be reclassified to a more specific diagnosis after multidisciplinary discussion and/or additional consultation.   dx = diagnosis; HRCT = high-resolution computed tomography; IPF = idiopathic pulmonary fibrosis; UIP = usual interstitial pneumonia.

## 2020-01-05 ENCOUNTER — Telehealth: Payer: Self-pay | Admitting: Pulmonary Disease

## 2020-01-05 NOTE — Telephone Encounter (Signed)
I have called this patient twice to try and get his appts moved up to a sooner one. I talked to wife Tilda Burrow first time and then left a voicemail the second. They have not called the office back to set up appt as requested. There is no openings with [facility] sooner at this time. Will keep appts as is

## 2020-01-19 DIAGNOSIS — R05 Cough: Secondary | ICD-10-CM | POA: Diagnosis not present

## 2020-01-19 DIAGNOSIS — J849 Interstitial pulmonary disease, unspecified: Secondary | ICD-10-CM | POA: Diagnosis not present

## 2020-01-19 DIAGNOSIS — R0602 Shortness of breath: Secondary | ICD-10-CM | POA: Diagnosis not present

## 2020-01-30 ENCOUNTER — Telehealth: Payer: Self-pay | Admitting: Pulmonary Disease

## 2020-01-30 ENCOUNTER — Other Ambulatory Visit (HOSPITAL_COMMUNITY)
Admission: RE | Admit: 2020-01-30 | Discharge: 2020-01-30 | Disposition: A | Discharge status: Home / Self Care | Payer: Medicare Other | Source: Ambulatory Visit | Attending: Pulmonary Disease | Admitting: Pulmonary Disease

## 2020-01-30 ENCOUNTER — Other Ambulatory Visit (HOSPITAL_COMMUNITY): Payer: Medicare Other

## 2020-01-31 NOTE — Telephone Encounter (Signed)
Error

## 2020-02-09 ENCOUNTER — Ambulatory Visit: Payer: Medicare Other | Admitting: Pulmonary Disease

## 2020-02-18 DIAGNOSIS — R0602 Shortness of breath: Secondary | ICD-10-CM | POA: Diagnosis not present

## 2020-02-18 DIAGNOSIS — J849 Interstitial pulmonary disease, unspecified: Secondary | ICD-10-CM | POA: Diagnosis not present

## 2020-02-18 DIAGNOSIS — R05 Cough: Secondary | ICD-10-CM | POA: Diagnosis not present

## 2020-03-20 DIAGNOSIS — J849 Interstitial pulmonary disease, unspecified: Secondary | ICD-10-CM | POA: Diagnosis not present

## 2020-03-20 DIAGNOSIS — R05 Cough: Secondary | ICD-10-CM | POA: Diagnosis not present

## 2020-03-20 DIAGNOSIS — R0602 Shortness of breath: Secondary | ICD-10-CM | POA: Diagnosis not present

## 2020-04-19 DIAGNOSIS — R05 Cough: Secondary | ICD-10-CM | POA: Diagnosis not present

## 2020-04-19 DIAGNOSIS — R0602 Shortness of breath: Secondary | ICD-10-CM | POA: Diagnosis not present

## 2020-04-19 DIAGNOSIS — J849 Interstitial pulmonary disease, unspecified: Secondary | ICD-10-CM | POA: Diagnosis not present

## 2020-05-20 DIAGNOSIS — R0602 Shortness of breath: Secondary | ICD-10-CM | POA: Diagnosis not present

## 2020-05-20 DIAGNOSIS — R05 Cough: Secondary | ICD-10-CM | POA: Diagnosis not present

## 2020-05-20 DIAGNOSIS — J849 Interstitial pulmonary disease, unspecified: Secondary | ICD-10-CM | POA: Diagnosis not present

## 2020-06-20 DIAGNOSIS — R0602 Shortness of breath: Secondary | ICD-10-CM | POA: Diagnosis not present

## 2020-06-20 DIAGNOSIS — R05 Cough: Secondary | ICD-10-CM | POA: Diagnosis not present

## 2020-06-20 DIAGNOSIS — J849 Interstitial pulmonary disease, unspecified: Secondary | ICD-10-CM | POA: Diagnosis not present

## 2020-07-20 DIAGNOSIS — J849 Interstitial pulmonary disease, unspecified: Secondary | ICD-10-CM | POA: Diagnosis not present

## 2020-07-20 DIAGNOSIS — R051 Acute cough: Secondary | ICD-10-CM | POA: Diagnosis not present

## 2020-07-20 DIAGNOSIS — R0602 Shortness of breath: Secondary | ICD-10-CM | POA: Diagnosis not present

## 2020-08-20 DIAGNOSIS — R0602 Shortness of breath: Secondary | ICD-10-CM | POA: Diagnosis not present

## 2020-08-20 DIAGNOSIS — R051 Acute cough: Secondary | ICD-10-CM | POA: Diagnosis not present

## 2020-08-20 DIAGNOSIS — J849 Interstitial pulmonary disease, unspecified: Secondary | ICD-10-CM | POA: Diagnosis not present

## 2020-09-19 DIAGNOSIS — R051 Acute cough: Secondary | ICD-10-CM | POA: Diagnosis not present

## 2020-09-19 DIAGNOSIS — R0602 Shortness of breath: Secondary | ICD-10-CM | POA: Diagnosis not present

## 2020-09-19 DIAGNOSIS — J849 Interstitial pulmonary disease, unspecified: Secondary | ICD-10-CM | POA: Diagnosis not present

## 2020-10-20 DIAGNOSIS — R0602 Shortness of breath: Secondary | ICD-10-CM | POA: Diagnosis not present

## 2020-10-20 DIAGNOSIS — J849 Interstitial pulmonary disease, unspecified: Secondary | ICD-10-CM | POA: Diagnosis not present

## 2020-10-20 DIAGNOSIS — R051 Acute cough: Secondary | ICD-10-CM | POA: Diagnosis not present

## 2020-11-19 DIAGNOSIS — R0602 Shortness of breath: Secondary | ICD-10-CM | POA: Diagnosis not present

## 2020-11-19 DIAGNOSIS — R051 Acute cough: Secondary | ICD-10-CM | POA: Diagnosis not present

## 2020-11-19 DIAGNOSIS — J849 Interstitial pulmonary disease, unspecified: Secondary | ICD-10-CM | POA: Diagnosis not present

## 2020-12-18 DIAGNOSIS — J849 Interstitial pulmonary disease, unspecified: Secondary | ICD-10-CM | POA: Diagnosis not present

## 2020-12-18 DIAGNOSIS — R051 Acute cough: Secondary | ICD-10-CM | POA: Diagnosis not present

## 2020-12-18 DIAGNOSIS — R0602 Shortness of breath: Secondary | ICD-10-CM | POA: Diagnosis not present

## 2021-01-18 DIAGNOSIS — R0602 Shortness of breath: Secondary | ICD-10-CM | POA: Diagnosis not present

## 2021-01-18 DIAGNOSIS — R051 Acute cough: Secondary | ICD-10-CM | POA: Diagnosis not present

## 2021-01-18 DIAGNOSIS — J849 Interstitial pulmonary disease, unspecified: Secondary | ICD-10-CM | POA: Diagnosis not present

## 2021-02-17 DIAGNOSIS — R0602 Shortness of breath: Secondary | ICD-10-CM | POA: Diagnosis not present

## 2021-02-17 DIAGNOSIS — J849 Interstitial pulmonary disease, unspecified: Secondary | ICD-10-CM | POA: Diagnosis not present

## 2021-02-17 DIAGNOSIS — R051 Acute cough: Secondary | ICD-10-CM | POA: Diagnosis not present

## 2021-03-20 DIAGNOSIS — J849 Interstitial pulmonary disease, unspecified: Secondary | ICD-10-CM | POA: Diagnosis not present

## 2021-03-20 DIAGNOSIS — R051 Acute cough: Secondary | ICD-10-CM | POA: Diagnosis not present

## 2021-03-20 DIAGNOSIS — R0602 Shortness of breath: Secondary | ICD-10-CM | POA: Diagnosis not present

## 2021-04-03 IMAGING — CT CT CHEST HIGH RESOLUTION W/O CM
2 of 7 series · 14 of 36 positions shown, 17 images · non-contrast
Comparison: 10/21/2018, 11/19/2017.

CLINICAL DATA: Chronic shortness of breath with exertion.

EXAM:
CT CHEST WITHOUT CONTRAST
TECHNIQUE: Multidetector CT imaging of the chest was performed following the
standard protocol without intravenous contrast. High resolution
imaging of the lungs, as well as inspiratory and expiratory imaging,
was performed.

[Series 4: high resolution · axial · 0.71mm/px · z∈[-338,-68]mm · 11 of 326 slices shown, 14 images]
[im 28/326  mediastinal]
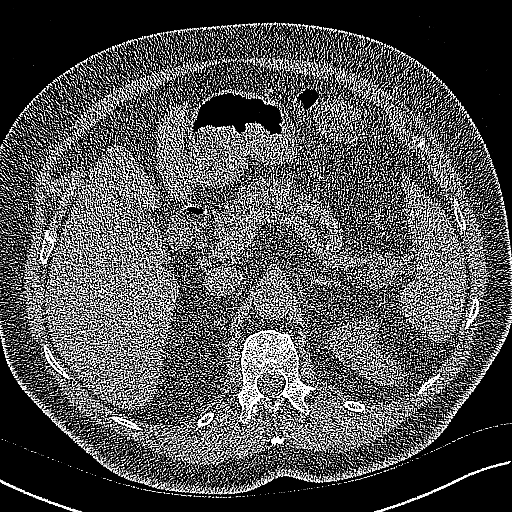
[im 28/326  lung]
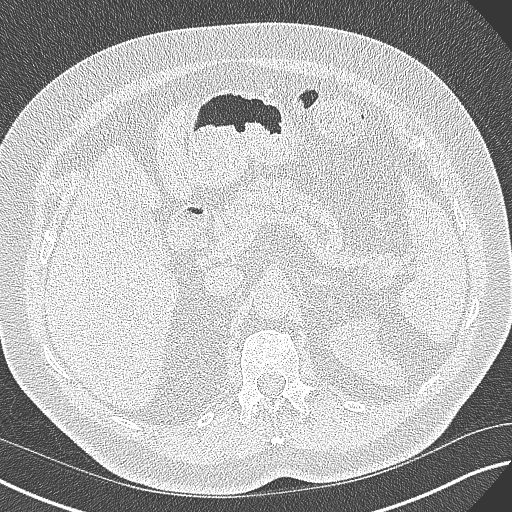
[im 55/326  lung]
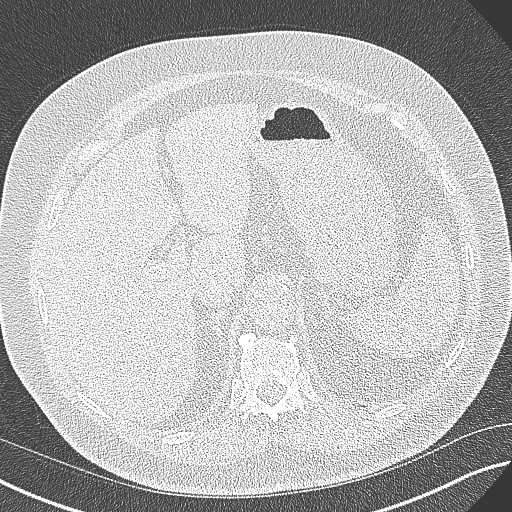
[im 82/326  lung]
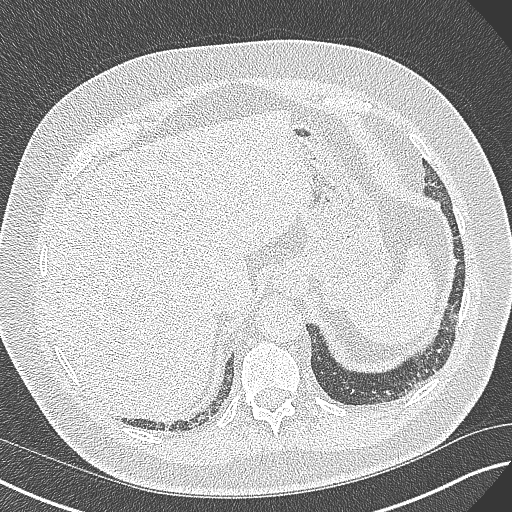
[im 109/326  lung]
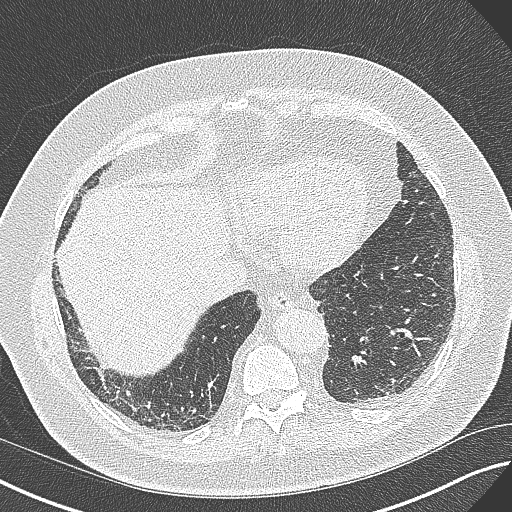
[im 136/326  mediastinal]
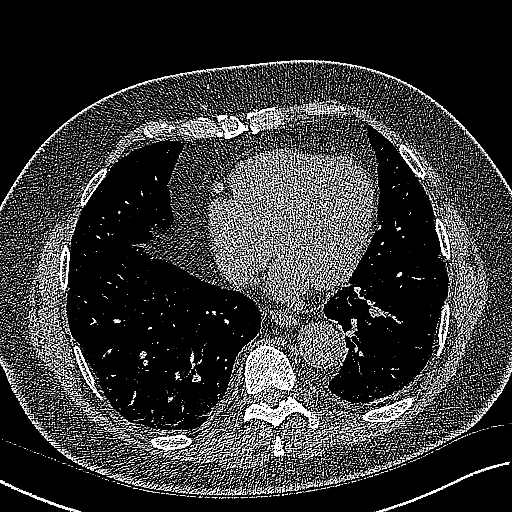
[im 136/326  lung]
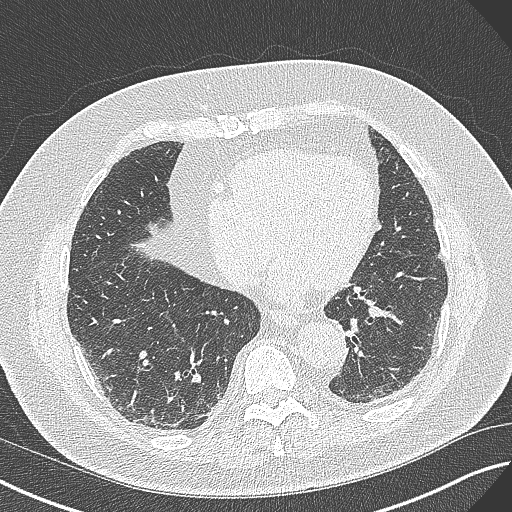
[im 163/326  lung]
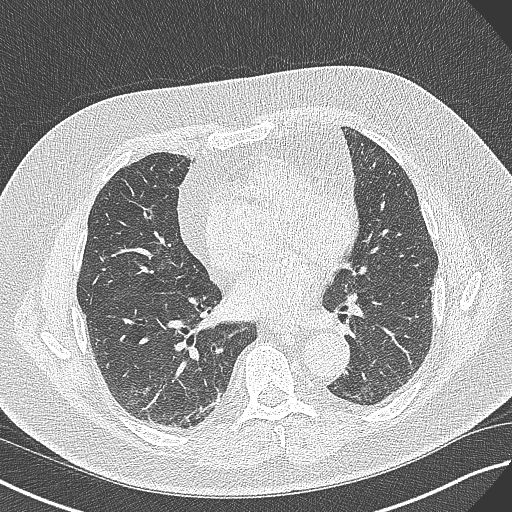
[im 190/326  lung]
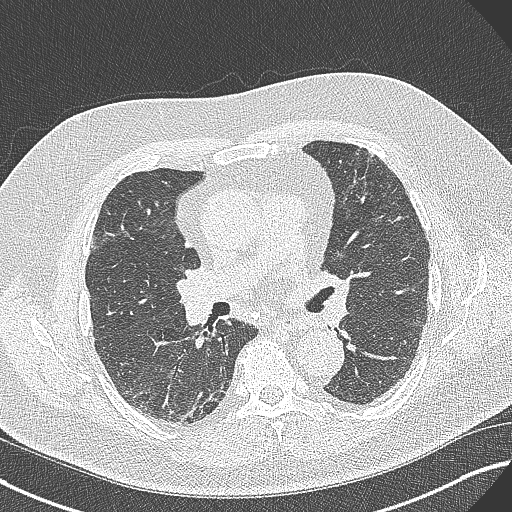
[im 217/326  lung]
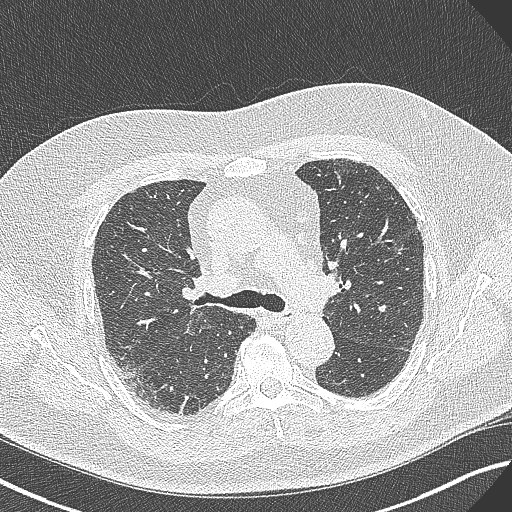
[im 244/326  mediastinal]
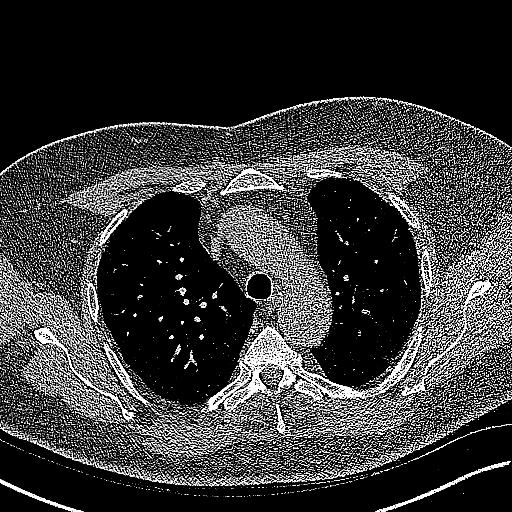
[im 244/326  lung]
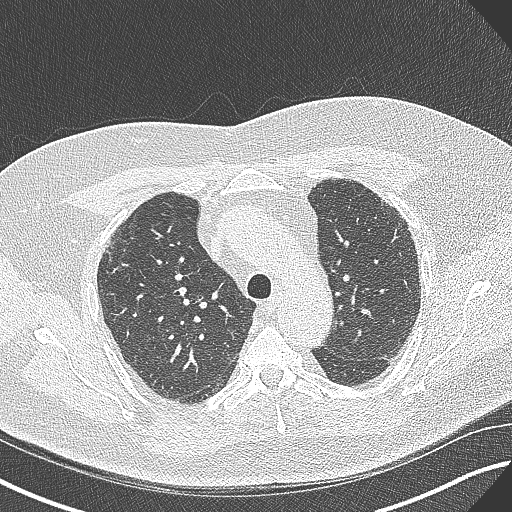
[im 271/326  lung]
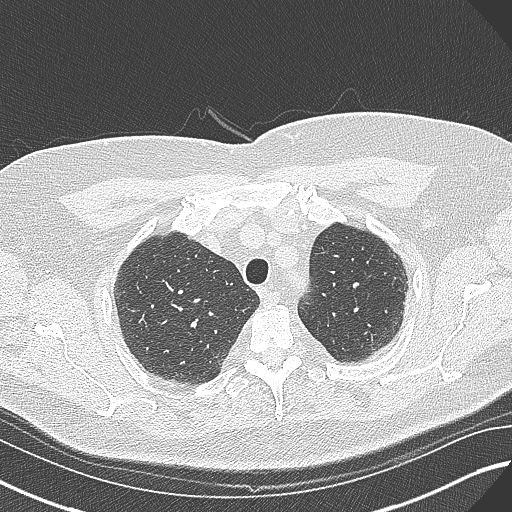
[im 298/326  lung]
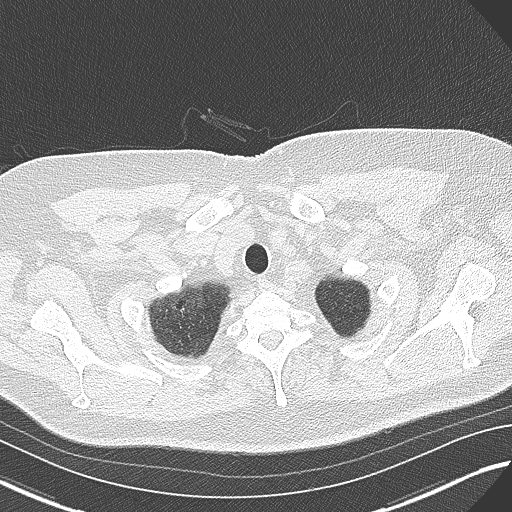

[Series 6: coronal · coronal · 0.64mm/px · 3 of 136 slices shown]
[im 28/136  lung]
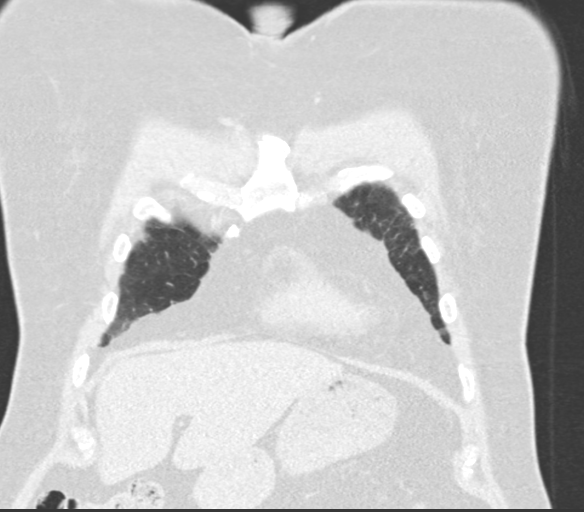
[im 55/136  lung]
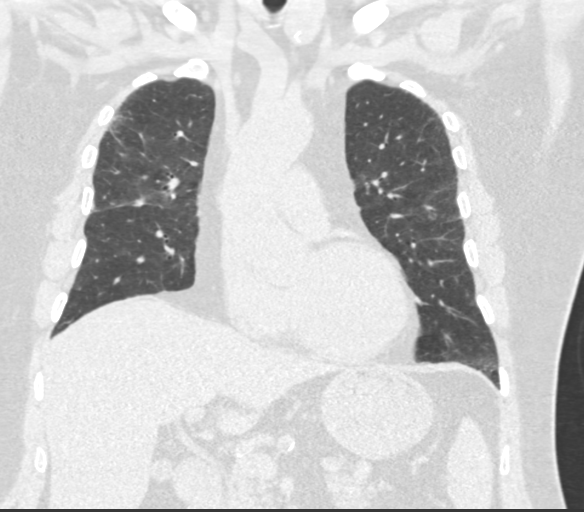
[im 82/136  lung]
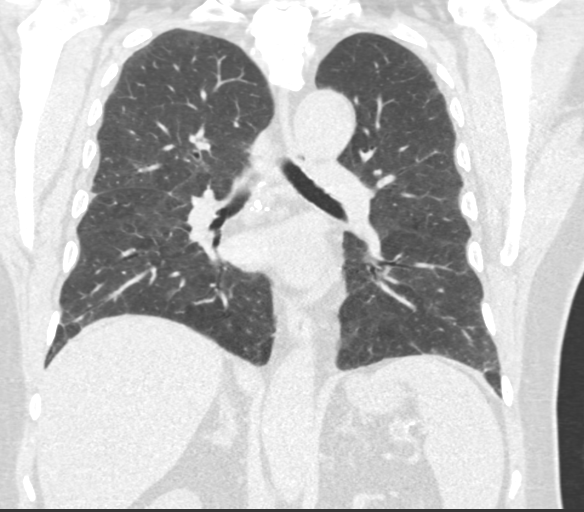

[14 of 36 positions shown; findings below may reference images not displayed]

FINDINGS: Cardiovascular: Atherosclerotic calcification of the aorta, aortic
valve and coronary arteries. Heart size within normal limits. No
pericardial effusion.

Mediastinum/Nodes: Left lobe of the thyroid is asymmetrically
enlarged and contains a 1.5 cm low-density nodule in the lower pole
([DATE]), similar. No pathologically enlarged mediastinal or axillary
lymph nodes. There are calcified subcarinal and right hilar lymph
nodes. Esophagus is grossly unremarkable.

Lungs/Pleura: Centrilobular emphysema. Peripheral and basilar
predominant subpleural reticulation and ground-glass with traction
bronchiolectasis. Findings appear slightly progressive from
10/21/2018. The possibility of superimposed dependent atelectasis in
both lower lobes is considered. 5 mm apical segment right upper lobe
nodule ([DATE]), similar to minimally smaller. Calcified granulomas.
No honeycombing. There is air trapping. No pleural fluid. Airway is
unremarkable.

Upper Abdomen: Visualized portions of the liver, gallbladder,
adrenal glands, kidneys, spleen, pancreas stomach and bowel are
unremarkable. No upper abdominal adenopathy.

Musculoskeletal: Degenerative changes in the spine. No worrisome
lytic or sclerotic lesions.
IMPRESSION: 1. Pulmonary parenchymal pattern of fibrosis appears slightly
progressive from 10/21/2018 and may be due to nonspecific
interstitial pneumonitis or usual interstitial pneumonitis. Findings
are indeterminate for UIP per consensus guidelines: Diagnosis of
Idiopathic Pulmonary Fibrosis: An Official ATS/ERS/JRS/ALAT Clinical
Practice Guideline. Am J Respir Crit Care Med Vol 198, Gebhard 5,
ppe88-e[DATE].
2. 5 mm apical segment right upper lobe nodule, similar to minimally
smaller.
3. Air trapping is indicative of small airways disease.
4. Asymmetric enlargement of the left thyroid with a 1.5 cm lower
pole nodule. Recommend thyroid US (ref: [HOSPITAL]. [DATE]): 143-50).
5. Aortic atherosclerosis (OEOXR-9JT.T). Coronary artery
calcification.
6.  Emphysema (OEOXR-07U.T).

## 2021-04-19 DIAGNOSIS — R051 Acute cough: Secondary | ICD-10-CM | POA: Diagnosis not present

## 2021-04-19 DIAGNOSIS — J849 Interstitial pulmonary disease, unspecified: Secondary | ICD-10-CM | POA: Diagnosis not present

## 2021-04-19 DIAGNOSIS — R0602 Shortness of breath: Secondary | ICD-10-CM | POA: Diagnosis not present

## 2021-05-20 DIAGNOSIS — R0602 Shortness of breath: Secondary | ICD-10-CM | POA: Diagnosis not present

## 2021-05-20 DIAGNOSIS — J849 Interstitial pulmonary disease, unspecified: Secondary | ICD-10-CM | POA: Diagnosis not present

## 2021-05-20 DIAGNOSIS — R051 Acute cough: Secondary | ICD-10-CM | POA: Diagnosis not present

## 2021-06-10 ENCOUNTER — Other Ambulatory Visit: Payer: Self-pay

## 2021-06-10 ENCOUNTER — Encounter: Payer: Self-pay | Admitting: Pulmonary Disease

## 2021-06-10 ENCOUNTER — Ambulatory Visit: Payer: Medicare Other | Admitting: Pulmonary Disease

## 2021-06-10 VITALS — BP 144/86 | HR 61 | Temp 97.8°F | Ht 72.0 in | Wt 237.6 lb

## 2021-06-10 DIAGNOSIS — R0602 Shortness of breath: Secondary | ICD-10-CM

## 2021-06-10 DIAGNOSIS — J849 Interstitial pulmonary disease, unspecified: Secondary | ICD-10-CM

## 2021-06-10 MED ORDER — TRELEGY ELLIPTA 100-62.5-25 MCG/INH IN AEPB: 2.0000 | INHALATION_SPRAY | Freq: Every day | RESPIRATORY_TRACT | 0 refills | Status: AC

## 2021-06-10 NOTE — Assessment & Plan Note (Signed)
Surprisingly spirometry did not show evidence of airway obstruction but his clinical course is consistent with COPD he seems to be recovering from a recent bout of acute bronchitis He only prefers to use as needed medications.  We will give him a sample of Trelegy today to use on an as-needed basis and he will call for prescription of this works. Also refill his albuterol

## 2021-06-10 NOTE — Assessment & Plan Note (Signed)
This was felt to be probable UIP on prior imaging and multidisciplinary discussion. We will reassess with high-resolution CT chest and PFTs

## 2021-06-10 NOTE — Progress Notes (Signed)
   Subjective:    Patient ID: David Castillo, male    DOB: 07-16-1951, 70 y.o.   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 patient does not want bx, does not want anti fibrotic Rx (in the past) ) but came back 2y later in jan 2021 with acute symptoms.Serology neg, CVD stigmata - negative.  In Jan 2021 CXR had diffuse infiltates -> Rx steroids and responded.   He now returns for follow-up after 18 months.  He is caring for his 38 year old mother who was on hospice.  He developed chest congestion, again did not take any medications but this seemed to resolve spontaneously and breathing improved, is now back to baseline. On his last visit had given him Breo and asked him to schedule PFTs which he has not done , has not used Breo or albuterol and these medications have expired. Wife accompanies and corroborates history.  He does not want to "get hooked on any medications."  Significant tests/ events reviewed  HRCT 10/2019 probable UIP HRCT 10/21/18 >>" indeterminate ", scattered areas of septal thickening and subpleural reticulation in both lower lobes, stable compared to 10/2017   CT chest 11/19/17 showed mosaic perfusion of the lungs and mild scarring at the bases, 8 mm groundglass opacity right lung apex, ascending thoracic aorta 40 mm   11/2018 ANA neg, CCP neg Spirometry 11/2016 showed ratio of 68 with FEV1 of 72% and FVC 53% that improved 18% with bronchodilators of 63%.  FEV1 did not change much. Echo 12/03/17 showed mild LVH, mildly  dilated left atrium, grade 1 diastolic dysfunction.   Past Medical History:  Diagnosis Date   Hypertension    Shortness of breath    SOB (shortness of breath)    Weakness      Review of Systems neg for any significant sore throat, dysphagia, itching, sneezing, nasal congestion or excess/ purulent secretions, fever, chills, sweats, unintended wt loss, pleuritic or exertional cp, hempoptysis, orthopnea pnd or change in chronic leg swelling. Also denies presyncope, palpitations, heartburn, abdominal pain, nausea, vomiting, diarrhea or change in bowel or urinary habits, dysuria,hematuria, rash, arthralgias, visual complaints, headache, numbness weakness or ataxia.     Objective:   Physical Exam  Gen. Pleasant, obese, in no distress ENT - no lesions, no post nasal drip Neck: No JVD, no thyromegaly, no carotid bruits Lungs: no use of accessory muscles, no dullness to percussion, decreased without rales or rhonchi  Cardiovascular: Rhythm regular, heart sounds  normal, no murmurs or gallops, no peripheral edema Musculoskeletal: No deformities, no cyanosis or clubbing , no tremors       Assessment & Plan:

## 2021-06-10 NOTE — Patient Instructions (Signed)
You may have had a bronchitis last week that got better Sample & RX for Trelegy 100 - ok to use as needed  Schedule HRCT chest & PFTs at [facility]

## 2021-06-11 ENCOUNTER — Telehealth: Payer: Self-pay | Admitting: Pulmonary Disease

## 2021-06-11 NOTE — Telephone Encounter (Signed)
Routing to Dr. Elsworth Soho as Juluis Rainier. Nothing further needed at this time.

## 2021-06-11 NOTE — Telephone Encounter (Signed)
FYI - I scheduled pt for pft on 11/15 at [facility].  Covid test to be done 11/11.  I called pt to give appt info and spoke to wife - she is on DPR.  She states pt will not go separate day to get covid test - he had issue last time he got one.  I asked if he had both vaccines, if so he could come to our office with no covid test.  She stated he has not and will not get vaccines.  She states they will get rescheduled when covid test is no longer required.  He has appt with RA on 12/5.

## 2021-06-20 DIAGNOSIS — J849 Interstitial pulmonary disease, unspecified: Secondary | ICD-10-CM | POA: Diagnosis not present

## 2021-06-20 DIAGNOSIS — R0602 Shortness of breath: Secondary | ICD-10-CM | POA: Diagnosis not present

## 2021-06-20 DIAGNOSIS — R051 Acute cough: Secondary | ICD-10-CM | POA: Diagnosis not present

## 2021-06-27 ENCOUNTER — Ambulatory Visit (HOSPITAL_COMMUNITY): Payer: Medicare Other | Attending: Pulmonary Disease

## 2021-07-20 DIAGNOSIS — J849 Interstitial pulmonary disease, unspecified: Secondary | ICD-10-CM | POA: Diagnosis not present

## 2021-07-20 DIAGNOSIS — R0602 Shortness of breath: Secondary | ICD-10-CM | POA: Diagnosis not present

## 2021-07-20 DIAGNOSIS — R051 Acute cough: Secondary | ICD-10-CM | POA: Diagnosis not present

## 2021-07-22 DIAGNOSIS — H6691 Otitis media, unspecified, right ear: Secondary | ICD-10-CM | POA: Diagnosis not present

## 2021-08-02 ENCOUNTER — Other Ambulatory Visit (HOSPITAL_COMMUNITY): Payer: Medicare Other

## 2021-08-06 ENCOUNTER — Encounter (HOSPITAL_COMMUNITY): Payer: Medicare Other

## 2021-08-20 DIAGNOSIS — R0602 Shortness of breath: Secondary | ICD-10-CM | POA: Diagnosis not present

## 2021-08-20 DIAGNOSIS — R051 Acute cough: Secondary | ICD-10-CM | POA: Diagnosis not present

## 2021-08-20 DIAGNOSIS — J849 Interstitial pulmonary disease, unspecified: Secondary | ICD-10-CM | POA: Diagnosis not present

## 2021-08-26 ENCOUNTER — Ambulatory Visit: Payer: Medicare Other | Admitting: Pulmonary Disease

## 2021-08-27 ENCOUNTER — Telehealth: Payer: Self-pay | Admitting: Pulmonary Disease

## 2021-08-27 MED ORDER — TRELEGY ELLIPTA 100-62.5-25 MCG/ACT IN AEPB: 1.0000 | INHALATION_SPRAY | Freq: Every day | RESPIRATORY_TRACT | 11 refills | Status: DC

## 2021-08-27 NOTE — Telephone Encounter (Signed)
I sent rx for trelegy 100 to preferred pharm and left detailed msg ok per DPR letting the pt know.

## 2021-09-02 DIAGNOSIS — H6120 Impacted cerumen, unspecified ear: Secondary | ICD-10-CM | POA: Diagnosis not present

## 2021-09-02 DIAGNOSIS — J209 Acute bronchitis, unspecified: Secondary | ICD-10-CM | POA: Diagnosis not present

## 2021-09-05 ENCOUNTER — Other Ambulatory Visit: Payer: Self-pay

## 2021-09-05 MED ORDER — TRELEGY ELLIPTA 100-62.5-25 MCG/ACT IN AEPB: 1.0000 | INHALATION_SPRAY | Freq: Every day | RESPIRATORY_TRACT | 11 refills | Status: AC

## 2021-09-19 DIAGNOSIS — R051 Acute cough: Secondary | ICD-10-CM | POA: Diagnosis not present

## 2021-09-19 DIAGNOSIS — R0602 Shortness of breath: Secondary | ICD-10-CM | POA: Diagnosis not present

## 2021-09-19 DIAGNOSIS — J849 Interstitial pulmonary disease, unspecified: Secondary | ICD-10-CM | POA: Diagnosis not present

## 2021-10-20 DIAGNOSIS — R0602 Shortness of breath: Secondary | ICD-10-CM | POA: Diagnosis not present

## 2021-10-20 DIAGNOSIS — J849 Interstitial pulmonary disease, unspecified: Secondary | ICD-10-CM | POA: Diagnosis not present

## 2021-10-20 DIAGNOSIS — R051 Acute cough: Secondary | ICD-10-CM | POA: Diagnosis not present

## 2021-11-15 ENCOUNTER — Other Ambulatory Visit: Payer: Self-pay

## 2021-11-15 ENCOUNTER — Emergency Department (HOSPITAL_COMMUNITY)
Admission: EM | Admit: 2021-11-15 | Discharge: 2021-11-15 | Discharge status: Against Medical Advice | Payer: Medicare Other | Attending: Emergency Medicine | Admitting: Emergency Medicine

## 2021-11-15 ENCOUNTER — Encounter (HOSPITAL_COMMUNITY): Payer: Self-pay

## 2021-11-15 DIAGNOSIS — R6884 Jaw pain: Secondary | ICD-10-CM | POA: Insufficient documentation

## 2021-11-15 DIAGNOSIS — Z743 Need for continuous supervision: Secondary | ICD-10-CM | POA: Diagnosis not present

## 2021-11-15 DIAGNOSIS — M279 Disease of jaws, unspecified: Secondary | ICD-10-CM | POA: Diagnosis not present

## 2021-11-15 DIAGNOSIS — R609 Edema, unspecified: Secondary | ICD-10-CM | POA: Diagnosis not present

## 2021-11-15 DIAGNOSIS — Z5321 Procedure and treatment not carried out due to patient leaving prior to being seen by health care provider: Secondary | ICD-10-CM | POA: Diagnosis not present

## 2021-11-15 DIAGNOSIS — R6889 Other general symptoms and signs: Secondary | ICD-10-CM | POA: Diagnosis not present

## 2021-11-15 NOTE — ED Triage Notes (Signed)
Per registration pt states that he is going to his PMD and left.
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
## 2021-11-16 DIAGNOSIS — S161XXA Strain of muscle, fascia and tendon at neck level, initial encounter: Secondary | ICD-10-CM | POA: Diagnosis not present

## 2021-11-16 DIAGNOSIS — S00531A Contusion of lip, initial encounter: Secondary | ICD-10-CM | POA: Diagnosis not present

## 2021-11-16 DIAGNOSIS — R519 Headache, unspecified: Secondary | ICD-10-CM | POA: Diagnosis not present

## 2021-11-16 DIAGNOSIS — M47812 Spondylosis without myelopathy or radiculopathy, cervical region: Secondary | ICD-10-CM | POA: Diagnosis not present

## 2021-11-16 DIAGNOSIS — M5033 Other cervical disc degeneration, cervicothoracic region: Secondary | ICD-10-CM | POA: Diagnosis not present

## 2021-11-16 DIAGNOSIS — K0889 Other specified disorders of teeth and supporting structures: Secondary | ICD-10-CM | POA: Diagnosis not present

## 2021-11-16 DIAGNOSIS — M542 Cervicalgia: Secondary | ICD-10-CM | POA: Diagnosis not present

## 2021-11-16 DIAGNOSIS — S069XAA Unspecified intracranial injury with loss of consciousness status unknown, initial encounter: Secondary | ICD-10-CM | POA: Diagnosis not present

## 2021-11-16 DIAGNOSIS — S060X1A Concussion with loss of consciousness of 30 minutes or less, initial encounter: Secondary | ICD-10-CM | POA: Diagnosis not present

## 2021-11-19 DIAGNOSIS — R051 Acute cough: Secondary | ICD-10-CM | POA: Diagnosis not present

## 2021-11-19 DIAGNOSIS — R0602 Shortness of breath: Secondary | ICD-10-CM | POA: Diagnosis not present

## 2021-11-19 DIAGNOSIS — J849 Interstitial pulmonary disease, unspecified: Secondary | ICD-10-CM | POA: Diagnosis not present

## 2021-11-20 DIAGNOSIS — H6123 Impacted cerumen, bilateral: Secondary | ICD-10-CM | POA: Diagnosis not present

## 2021-11-20 DIAGNOSIS — H9203 Otalgia, bilateral: Secondary | ICD-10-CM | POA: Diagnosis not present

## 2021-11-20 DIAGNOSIS — H903 Sensorineural hearing loss, bilateral: Secondary | ICD-10-CM | POA: Diagnosis not present

## 2021-11-22 DIAGNOSIS — M25512 Pain in left shoulder: Secondary | ICD-10-CM | POA: Diagnosis not present

## 2021-11-22 DIAGNOSIS — M542 Cervicalgia: Secondary | ICD-10-CM | POA: Diagnosis not present

## 2021-12-18 DIAGNOSIS — R051 Acute cough: Secondary | ICD-10-CM | POA: Diagnosis not present

## 2021-12-18 DIAGNOSIS — R0602 Shortness of breath: Secondary | ICD-10-CM | POA: Diagnosis not present

## 2021-12-18 DIAGNOSIS — J849 Interstitial pulmonary disease, unspecified: Secondary | ICD-10-CM | POA: Diagnosis not present

## 2022-01-18 DIAGNOSIS — J849 Interstitial pulmonary disease, unspecified: Secondary | ICD-10-CM | POA: Diagnosis not present

## 2022-01-18 DIAGNOSIS — R051 Acute cough: Secondary | ICD-10-CM | POA: Diagnosis not present

## 2022-01-18 DIAGNOSIS — R0602 Shortness of breath: Secondary | ICD-10-CM | POA: Diagnosis not present

## 2022-02-10 DIAGNOSIS — H9212 Otorrhea, left ear: Secondary | ICD-10-CM | POA: Diagnosis not present

## 2022-02-10 DIAGNOSIS — H9203 Otalgia, bilateral: Secondary | ICD-10-CM | POA: Diagnosis not present

## 2022-02-10 DIAGNOSIS — H6122 Impacted cerumen, left ear: Secondary | ICD-10-CM | POA: Diagnosis not present

## 2022-02-10 DIAGNOSIS — H903 Sensorineural hearing loss, bilateral: Secondary | ICD-10-CM | POA: Diagnosis not present

## 2022-02-17 DIAGNOSIS — R051 Acute cough: Secondary | ICD-10-CM | POA: Diagnosis not present

## 2022-02-17 DIAGNOSIS — R0602 Shortness of breath: Secondary | ICD-10-CM | POA: Diagnosis not present

## 2022-02-17 DIAGNOSIS — J849 Interstitial pulmonary disease, unspecified: Secondary | ICD-10-CM | POA: Diagnosis not present

## 2022-03-20 DIAGNOSIS — R0602 Shortness of breath: Secondary | ICD-10-CM | POA: Diagnosis not present

## 2022-03-20 DIAGNOSIS — J849 Interstitial pulmonary disease, unspecified: Secondary | ICD-10-CM | POA: Diagnosis not present

## 2022-03-20 DIAGNOSIS — R051 Acute cough: Secondary | ICD-10-CM | POA: Diagnosis not present

## 2022-04-19 DIAGNOSIS — J849 Interstitial pulmonary disease, unspecified: Secondary | ICD-10-CM | POA: Diagnosis not present

## 2022-04-19 DIAGNOSIS — R0602 Shortness of breath: Secondary | ICD-10-CM | POA: Diagnosis not present

## 2022-04-19 DIAGNOSIS — R051 Acute cough: Secondary | ICD-10-CM | POA: Diagnosis not present

## 2022-05-20 DIAGNOSIS — J849 Interstitial pulmonary disease, unspecified: Secondary | ICD-10-CM | POA: Diagnosis not present

## 2022-05-20 DIAGNOSIS — R051 Acute cough: Secondary | ICD-10-CM | POA: Diagnosis not present

## 2022-05-20 DIAGNOSIS — R0602 Shortness of breath: Secondary | ICD-10-CM | POA: Diagnosis not present

## 2022-06-20 DIAGNOSIS — R051 Acute cough: Secondary | ICD-10-CM | POA: Diagnosis not present

## 2022-06-20 DIAGNOSIS — R0602 Shortness of breath: Secondary | ICD-10-CM | POA: Diagnosis not present

## 2022-06-20 DIAGNOSIS — J849 Interstitial pulmonary disease, unspecified: Secondary | ICD-10-CM | POA: Diagnosis not present

## 2022-07-20 DIAGNOSIS — R051 Acute cough: Secondary | ICD-10-CM | POA: Diagnosis not present

## 2022-07-20 DIAGNOSIS — J849 Interstitial pulmonary disease, unspecified: Secondary | ICD-10-CM | POA: Diagnosis not present

## 2022-07-20 DIAGNOSIS — R0602 Shortness of breath: Secondary | ICD-10-CM | POA: Diagnosis not present

## 2022-08-20 DIAGNOSIS — J849 Interstitial pulmonary disease, unspecified: Secondary | ICD-10-CM | POA: Diagnosis not present

## 2022-08-20 DIAGNOSIS — R051 Acute cough: Secondary | ICD-10-CM | POA: Diagnosis not present

## 2022-08-20 DIAGNOSIS — R0602 Shortness of breath: Secondary | ICD-10-CM | POA: Diagnosis not present

## 2022-08-29 DIAGNOSIS — M545 Low back pain, unspecified: Secondary | ICD-10-CM | POA: Diagnosis not present

## 2022-09-19 DIAGNOSIS — R0602 Shortness of breath: Secondary | ICD-10-CM | POA: Diagnosis not present

## 2022-09-19 DIAGNOSIS — R051 Acute cough: Secondary | ICD-10-CM | POA: Diagnosis not present

## 2022-09-19 DIAGNOSIS — J849 Interstitial pulmonary disease, unspecified: Secondary | ICD-10-CM | POA: Diagnosis not present

## 2022-09-29 DIAGNOSIS — M48061 Spinal stenosis, lumbar region without neurogenic claudication: Secondary | ICD-10-CM | POA: Diagnosis not present

## 2022-09-29 DIAGNOSIS — M5116 Intervertebral disc disorders with radiculopathy, lumbar region: Secondary | ICD-10-CM | POA: Diagnosis not present

## 2022-09-29 DIAGNOSIS — M545 Low back pain, unspecified: Secondary | ICD-10-CM | POA: Diagnosis not present

## 2022-10-02 DIAGNOSIS — R590 Localized enlarged lymph nodes: Secondary | ICD-10-CM | POA: Diagnosis not present

## 2022-10-02 DIAGNOSIS — M545 Low back pain, unspecified: Secondary | ICD-10-CM | POA: Diagnosis not present

## 2022-10-02 DIAGNOSIS — R0602 Shortness of breath: Secondary | ICD-10-CM | POA: Diagnosis not present

## 2022-10-02 DIAGNOSIS — R1900 Intra-abdominal and pelvic swelling, mass and lump, unspecified site: Secondary | ICD-10-CM | POA: Diagnosis not present

## 2022-10-22 DIAGNOSIS — R590 Localized enlarged lymph nodes: Secondary | ICD-10-CM | POA: Diagnosis not present

## 2022-10-22 DIAGNOSIS — C8460 Anaplastic large cell lymphoma, ALK-positive, unspecified site: Secondary | ICD-10-CM | POA: Diagnosis not present

## 2022-10-22 DIAGNOSIS — R1909 Other intra-abdominal and pelvic swelling, mass and lump: Secondary | ICD-10-CM | POA: Diagnosis not present

## 2022-10-22 DIAGNOSIS — C8463 Anaplastic large cell lymphoma, ALK-positive, intra-abdominal lymph nodes: Secondary | ICD-10-CM | POA: Diagnosis not present

## 2022-10-31 DIAGNOSIS — C888 Other malignant immunoproliferative diseases: Secondary | ICD-10-CM | POA: Diagnosis not present

## 2022-10-31 DIAGNOSIS — I77810 Thoracic aortic ectasia: Secondary | ICD-10-CM | POA: Diagnosis not present

## 2022-10-31 DIAGNOSIS — Z0189 Encounter for other specified special examinations: Secondary | ICD-10-CM | POA: Diagnosis not present

## 2022-10-31 DIAGNOSIS — G893 Neoplasm related pain (acute) (chronic): Secondary | ICD-10-CM | POA: Diagnosis not present

## 2022-10-31 DIAGNOSIS — Z1159 Encounter for screening for other viral diseases: Secondary | ICD-10-CM | POA: Diagnosis not present

## 2022-10-31 DIAGNOSIS — C8463 Anaplastic large cell lymphoma, ALK-positive, intra-abdominal lymph nodes: Secondary | ICD-10-CM | POA: Diagnosis not present

## 2022-10-31 DIAGNOSIS — D508 Other iron deficiency anemias: Secondary | ICD-10-CM | POA: Diagnosis not present

## 2022-10-31 DIAGNOSIS — I071 Rheumatic tricuspid insufficiency: Secondary | ICD-10-CM | POA: Diagnosis not present

## 2022-10-31 DIAGNOSIS — I517 Cardiomegaly: Secondary | ICD-10-CM | POA: Diagnosis not present

## 2022-10-31 DIAGNOSIS — J849 Interstitial pulmonary disease, unspecified: Secondary | ICD-10-CM | POA: Diagnosis not present

## 2022-11-03 DIAGNOSIS — C8463 Anaplastic large cell lymphoma, ALK-positive, intra-abdominal lymph nodes: Secondary | ICD-10-CM | POA: Diagnosis not present

## 2022-11-03 DIAGNOSIS — J449 Chronic obstructive pulmonary disease, unspecified: Secondary | ICD-10-CM | POA: Diagnosis not present

## 2022-11-03 DIAGNOSIS — I2510 Atherosclerotic heart disease of native coronary artery without angina pectoris: Secondary | ICD-10-CM | POA: Diagnosis not present

## 2022-11-03 DIAGNOSIS — Z452 Encounter for adjustment and management of vascular access device: Secondary | ICD-10-CM | POA: Diagnosis not present

## 2022-11-03 DIAGNOSIS — C8460 Anaplastic large cell lymphoma, ALK-positive, unspecified site: Secondary | ICD-10-CM | POA: Diagnosis not present

## 2022-11-03 DIAGNOSIS — Z880 Allergy status to penicillin: Secondary | ICD-10-CM | POA: Diagnosis not present

## 2022-11-03 DIAGNOSIS — I10 Essential (primary) hypertension: Secondary | ICD-10-CM | POA: Diagnosis not present

## 2022-11-03 DIAGNOSIS — Z87891 Personal history of nicotine dependence: Secondary | ICD-10-CM | POA: Diagnosis not present

## 2022-11-03 DIAGNOSIS — Z79899 Other long term (current) drug therapy: Secondary | ICD-10-CM | POA: Diagnosis not present

## 2022-11-03 DIAGNOSIS — C8590 Non-Hodgkin lymphoma, unspecified, unspecified site: Secondary | ICD-10-CM | POA: Diagnosis not present

## 2022-11-05 DIAGNOSIS — R531 Weakness: Secondary | ICD-10-CM | POA: Diagnosis not present

## 2022-11-05 DIAGNOSIS — C8473 Anaplastic large cell lymphoma, ALK-negative, intra-abdominal lymph nodes: Secondary | ICD-10-CM | POA: Diagnosis not present

## 2022-11-05 DIAGNOSIS — Z880 Allergy status to penicillin: Secondary | ICD-10-CM | POA: Diagnosis not present

## 2022-11-05 DIAGNOSIS — C8590 Non-Hodgkin lymphoma, unspecified, unspecified site: Secondary | ICD-10-CM | POA: Diagnosis not present

## 2022-11-05 DIAGNOSIS — C8463 Anaplastic large cell lymphoma, ALK-positive, intra-abdominal lymph nodes: Secondary | ICD-10-CM | POA: Diagnosis not present

## 2022-11-06 DIAGNOSIS — Z5112 Encounter for antineoplastic immunotherapy: Secondary | ICD-10-CM | POA: Diagnosis not present

## 2022-11-06 DIAGNOSIS — Z5111 Encounter for antineoplastic chemotherapy: Secondary | ICD-10-CM | POA: Diagnosis not present

## 2022-11-06 DIAGNOSIS — C8460 Anaplastic large cell lymphoma, ALK-positive, unspecified site: Secondary | ICD-10-CM | POA: Diagnosis not present

## 2022-11-07 DIAGNOSIS — C8463 Anaplastic large cell lymphoma, ALK-positive, intra-abdominal lymph nodes: Secondary | ICD-10-CM | POA: Diagnosis not present

## 2022-11-07 DIAGNOSIS — C8460 Anaplastic large cell lymphoma, ALK-positive, unspecified site: Secondary | ICD-10-CM | POA: Diagnosis not present

## 2022-11-07 DIAGNOSIS — E876 Hypokalemia: Secondary | ICD-10-CM | POA: Diagnosis not present

## 2022-11-13 DIAGNOSIS — G893 Neoplasm related pain (acute) (chronic): Secondary | ICD-10-CM | POA: Diagnosis not present

## 2022-11-13 DIAGNOSIS — C8460 Anaplastic large cell lymphoma, ALK-positive, unspecified site: Secondary | ICD-10-CM | POA: Diagnosis not present

## 2022-11-26 DIAGNOSIS — J849 Interstitial pulmonary disease, unspecified: Secondary | ICD-10-CM | POA: Diagnosis not present

## 2022-11-26 DIAGNOSIS — E860 Dehydration: Secondary | ICD-10-CM | POA: Diagnosis not present

## 2022-11-26 DIAGNOSIS — C8463 Anaplastic large cell lymphoma, ALK-positive, intra-abdominal lymph nodes: Secondary | ICD-10-CM | POA: Diagnosis not present

## 2022-11-26 DIAGNOSIS — Z5111 Encounter for antineoplastic chemotherapy: Secondary | ICD-10-CM | POA: Diagnosis not present

## 2022-11-26 DIAGNOSIS — C8460 Anaplastic large cell lymphoma, ALK-positive, unspecified site: Secondary | ICD-10-CM | POA: Diagnosis not present

## 2022-11-26 DIAGNOSIS — R55 Syncope and collapse: Secondary | ICD-10-CM | POA: Diagnosis not present

## 2022-11-26 DIAGNOSIS — E876 Hypokalemia: Secondary | ICD-10-CM | POA: Diagnosis not present

## 2022-11-27 DIAGNOSIS — C8463 Anaplastic large cell lymphoma, ALK-positive, intra-abdominal lymph nodes: Secondary | ICD-10-CM | POA: Diagnosis not present

## 2022-11-27 DIAGNOSIS — R55 Syncope and collapse: Secondary | ICD-10-CM | POA: Diagnosis not present

## 2022-11-27 DIAGNOSIS — E876 Hypokalemia: Secondary | ICD-10-CM | POA: Diagnosis not present

## 2022-11-27 DIAGNOSIS — E860 Dehydration: Secondary | ICD-10-CM | POA: Diagnosis not present

## 2022-12-01 DIAGNOSIS — I700 Atherosclerosis of aorta: Secondary | ICD-10-CM | POA: Diagnosis not present

## 2022-12-01 DIAGNOSIS — D7389 Other diseases of spleen: Secondary | ICD-10-CM | POA: Diagnosis not present

## 2022-12-01 DIAGNOSIS — C8470 Anaplastic large cell lymphoma, ALK-negative, unspecified site: Secondary | ICD-10-CM | POA: Diagnosis not present

## 2022-12-01 DIAGNOSIS — Z0189 Encounter for other specified special examinations: Secondary | ICD-10-CM | POA: Diagnosis not present

## 2022-12-01 DIAGNOSIS — R55 Syncope and collapse: Secondary | ICD-10-CM | POA: Diagnosis not present

## 2022-12-01 DIAGNOSIS — C8463 Anaplastic large cell lymphoma, ALK-positive, intra-abdominal lymph nodes: Secondary | ICD-10-CM | POA: Diagnosis not present

## 2022-12-01 DIAGNOSIS — R590 Localized enlarged lymph nodes: Secondary | ICD-10-CM | POA: Diagnosis not present

## 2022-12-04 DIAGNOSIS — R55 Syncope and collapse: Secondary | ICD-10-CM | POA: Diagnosis not present

## 2022-12-04 DIAGNOSIS — E876 Hypokalemia: Secondary | ICD-10-CM | POA: Diagnosis not present

## 2022-12-04 DIAGNOSIS — Z5111 Encounter for antineoplastic chemotherapy: Secondary | ICD-10-CM | POA: Diagnosis not present

## 2022-12-04 DIAGNOSIS — C8460 Anaplastic large cell lymphoma, ALK-positive, unspecified site: Secondary | ICD-10-CM | POA: Diagnosis not present

## 2022-12-04 DIAGNOSIS — C8463 Anaplastic large cell lymphoma, ALK-positive, intra-abdominal lymph nodes: Secondary | ICD-10-CM | POA: Diagnosis not present

## 2022-12-04 DIAGNOSIS — J849 Interstitial pulmonary disease, unspecified: Secondary | ICD-10-CM | POA: Diagnosis not present

## 2022-12-15 DIAGNOSIS — R55 Syncope and collapse: Secondary | ICD-10-CM | POA: Diagnosis not present

## 2022-12-18 DIAGNOSIS — R55 Syncope and collapse: Secondary | ICD-10-CM | POA: Diagnosis not present

## 2022-12-22 DIAGNOSIS — C888 Other malignant immunoproliferative diseases: Secondary | ICD-10-CM | POA: Diagnosis not present

## 2022-12-22 DIAGNOSIS — G893 Neoplasm related pain (acute) (chronic): Secondary | ICD-10-CM | POA: Diagnosis not present

## 2022-12-22 DIAGNOSIS — R079 Chest pain, unspecified: Secondary | ICD-10-CM | POA: Diagnosis not present

## 2022-12-22 DIAGNOSIS — Z5111 Encounter for antineoplastic chemotherapy: Secondary | ICD-10-CM | POA: Diagnosis not present

## 2022-12-22 DIAGNOSIS — C8463 Anaplastic large cell lymphoma, ALK-positive, intra-abdominal lymph nodes: Secondary | ICD-10-CM | POA: Diagnosis not present

## 2023-01-01 DIAGNOSIS — I10 Essential (primary) hypertension: Secondary | ICD-10-CM | POA: Diagnosis not present

## 2023-01-01 DIAGNOSIS — I480 Paroxysmal atrial fibrillation: Secondary | ICD-10-CM | POA: Diagnosis not present

## 2023-01-05 DIAGNOSIS — C8463 Anaplastic large cell lymphoma, ALK-positive, intra-abdominal lymph nodes: Secondary | ICD-10-CM | POA: Diagnosis not present

## 2023-01-13 DIAGNOSIS — C8463 Anaplastic large cell lymphoma, ALK-positive, intra-abdominal lymph nodes: Secondary | ICD-10-CM | POA: Diagnosis not present

## 2023-01-14 DIAGNOSIS — C8463 Anaplastic large cell lymphoma, ALK-positive, intra-abdominal lymph nodes: Secondary | ICD-10-CM | POA: Diagnosis not present

## 2023-01-14 DIAGNOSIS — Z5111 Encounter for antineoplastic chemotherapy: Secondary | ICD-10-CM | POA: Diagnosis not present

## 2023-01-15 DIAGNOSIS — C8463 Anaplastic large cell lymphoma, ALK-positive, intra-abdominal lymph nodes: Secondary | ICD-10-CM | POA: Diagnosis not present

## 2023-02-03 DIAGNOSIS — J849 Interstitial pulmonary disease, unspecified: Secondary | ICD-10-CM | POA: Diagnosis not present

## 2023-02-03 DIAGNOSIS — C888 Other malignant immunoproliferative diseases: Secondary | ICD-10-CM | POA: Diagnosis not present

## 2023-02-03 DIAGNOSIS — C8463 Anaplastic large cell lymphoma, ALK-positive, intra-abdominal lymph nodes: Secondary | ICD-10-CM | POA: Diagnosis not present

## 2023-02-03 DIAGNOSIS — G893 Neoplasm related pain (acute) (chronic): Secondary | ICD-10-CM | POA: Diagnosis not present

## 2023-02-04 DIAGNOSIS — C8463 Anaplastic large cell lymphoma, ALK-positive, intra-abdominal lymph nodes: Secondary | ICD-10-CM | POA: Diagnosis not present

## 2023-02-05 DIAGNOSIS — C8463 Anaplastic large cell lymphoma, ALK-positive, intra-abdominal lymph nodes: Secondary | ICD-10-CM | POA: Diagnosis not present

## 2023-02-24 DIAGNOSIS — Z5111 Encounter for antineoplastic chemotherapy: Secondary | ICD-10-CM | POA: Diagnosis not present

## 2023-02-24 DIAGNOSIS — G893 Neoplasm related pain (acute) (chronic): Secondary | ICD-10-CM | POA: Diagnosis not present

## 2023-02-24 DIAGNOSIS — C888 Other malignant immunoproliferative diseases: Secondary | ICD-10-CM | POA: Diagnosis not present

## 2023-02-24 DIAGNOSIS — R634 Abnormal weight loss: Secondary | ICD-10-CM | POA: Diagnosis not present

## 2023-02-24 DIAGNOSIS — J849 Interstitial pulmonary disease, unspecified: Secondary | ICD-10-CM | POA: Diagnosis not present

## 2023-02-24 DIAGNOSIS — R432 Parageusia: Secondary | ICD-10-CM | POA: Diagnosis not present

## 2023-02-24 DIAGNOSIS — C8463 Anaplastic large cell lymphoma, ALK-positive, intra-abdominal lymph nodes: Secondary | ICD-10-CM | POA: Diagnosis not present

## 2023-02-24 DIAGNOSIS — R5383 Other fatigue: Secondary | ICD-10-CM | POA: Diagnosis not present

## 2023-02-25 DIAGNOSIS — C8463 Anaplastic large cell lymphoma, ALK-positive, intra-abdominal lymph nodes: Secondary | ICD-10-CM | POA: Diagnosis not present

## 2023-02-26 DIAGNOSIS — C8463 Anaplastic large cell lymphoma, ALK-positive, intra-abdominal lymph nodes: Secondary | ICD-10-CM | POA: Diagnosis not present

## 2023-03-16 DIAGNOSIS — C8463 Anaplastic large cell lymphoma, ALK-positive, intra-abdominal lymph nodes: Secondary | ICD-10-CM | POA: Diagnosis not present

## 2023-03-16 DIAGNOSIS — G893 Neoplasm related pain (acute) (chronic): Secondary | ICD-10-CM | POA: Diagnosis not present

## 2023-03-16 DIAGNOSIS — C888 Other malignant immunoproliferative diseases: Secondary | ICD-10-CM | POA: Diagnosis not present

## 2023-03-16 DIAGNOSIS — R5383 Other fatigue: Secondary | ICD-10-CM | POA: Diagnosis not present

## 2023-03-19 DIAGNOSIS — Z5112 Encounter for antineoplastic immunotherapy: Secondary | ICD-10-CM | POA: Diagnosis not present

## 2023-03-19 DIAGNOSIS — Z5111 Encounter for antineoplastic chemotherapy: Secondary | ICD-10-CM | POA: Diagnosis not present

## 2023-03-19 DIAGNOSIS — C8463 Anaplastic large cell lymphoma, ALK-positive, intra-abdominal lymph nodes: Secondary | ICD-10-CM | POA: Diagnosis not present

## 2023-03-20 DIAGNOSIS — C8463 Anaplastic large cell lymphoma, ALK-positive, intra-abdominal lymph nodes: Secondary | ICD-10-CM | POA: Diagnosis not present

## 2023-04-06 DIAGNOSIS — C8463 Anaplastic large cell lymphoma, ALK-positive, intra-abdominal lymph nodes: Secondary | ICD-10-CM | POA: Diagnosis not present

## 2023-04-09 DIAGNOSIS — Z5111 Encounter for antineoplastic chemotherapy: Secondary | ICD-10-CM | POA: Diagnosis not present

## 2023-04-09 DIAGNOSIS — C8463 Anaplastic large cell lymphoma, ALK-positive, intra-abdominal lymph nodes: Secondary | ICD-10-CM | POA: Diagnosis not present

## 2023-04-09 DIAGNOSIS — Z5112 Encounter for antineoplastic immunotherapy: Secondary | ICD-10-CM | POA: Diagnosis not present

## 2023-04-10 DIAGNOSIS — C8463 Anaplastic large cell lymphoma, ALK-positive, intra-abdominal lymph nodes: Secondary | ICD-10-CM | POA: Diagnosis not present

## 2023-04-28 DIAGNOSIS — I480 Paroxysmal atrial fibrillation: Secondary | ICD-10-CM | POA: Diagnosis not present

## 2023-04-28 DIAGNOSIS — I10 Essential (primary) hypertension: Secondary | ICD-10-CM | POA: Diagnosis not present

## 2023-05-18 DIAGNOSIS — C8463 Anaplastic large cell lymphoma, ALK-positive, intra-abdominal lymph nodes: Secondary | ICD-10-CM | POA: Diagnosis not present

## 2023-05-18 DIAGNOSIS — C8590 Non-Hodgkin lymphoma, unspecified, unspecified site: Secondary | ICD-10-CM | POA: Diagnosis not present

## 2023-05-26 DIAGNOSIS — C8463 Anaplastic large cell lymphoma, ALK-positive, intra-abdominal lymph nodes: Secondary | ICD-10-CM | POA: Diagnosis not present

## 2023-06-04 DIAGNOSIS — C8463 Anaplastic large cell lymphoma, ALK-positive, intra-abdominal lymph nodes: Secondary | ICD-10-CM | POA: Diagnosis not present

## 2023-07-01 DIAGNOSIS — C8463 Anaplastic large cell lymphoma, ALK-positive, intra-abdominal lymph nodes: Secondary | ICD-10-CM | POA: Diagnosis not present

## 2023-07-10 DIAGNOSIS — Z452 Encounter for adjustment and management of vascular access device: Secondary | ICD-10-CM | POA: Diagnosis not present

## 2023-07-14 DIAGNOSIS — C8463 Anaplastic large cell lymphoma, ALK-positive, intra-abdominal lymph nodes: Secondary | ICD-10-CM | POA: Diagnosis not present

## 2023-07-14 DIAGNOSIS — T451X5A Adverse effect of antineoplastic and immunosuppressive drugs, initial encounter: Secondary | ICD-10-CM | POA: Diagnosis not present

## 2023-07-14 DIAGNOSIS — G620 Drug-induced polyneuropathy: Secondary | ICD-10-CM | POA: Diagnosis not present

## 2023-08-04 DIAGNOSIS — I700 Atherosclerosis of aorta: Secondary | ICD-10-CM | POA: Diagnosis not present

## 2023-08-04 DIAGNOSIS — N3289 Other specified disorders of bladder: Secondary | ICD-10-CM | POA: Diagnosis not present

## 2023-08-04 DIAGNOSIS — C8463 Anaplastic large cell lymphoma, ALK-positive, intra-abdominal lymph nodes: Secondary | ICD-10-CM | POA: Diagnosis not present

## 2023-08-04 DIAGNOSIS — E041 Nontoxic single thyroid nodule: Secondary | ICD-10-CM | POA: Diagnosis not present

## 2023-09-01 DIAGNOSIS — C8463 Anaplastic large cell lymphoma, ALK-positive, intra-abdominal lymph nodes: Secondary | ICD-10-CM | POA: Diagnosis not present

## 2023-09-08 DIAGNOSIS — C8463 Anaplastic large cell lymphoma, ALK-positive, intra-abdominal lymph nodes: Secondary | ICD-10-CM | POA: Diagnosis not present

## 2023-12-02 DIAGNOSIS — C8463 Anaplastic large cell lymphoma, ALK-positive, intra-abdominal lymph nodes: Secondary | ICD-10-CM | POA: Diagnosis not present

## 2023-12-07 DIAGNOSIS — C8463 Anaplastic large cell lymphoma, ALK-positive, intra-abdominal lymph nodes: Secondary | ICD-10-CM | POA: Diagnosis not present

## 2023-12-29 DIAGNOSIS — I480 Paroxysmal atrial fibrillation: Secondary | ICD-10-CM | POA: Diagnosis not present

## 2023-12-29 DIAGNOSIS — Z9221 Personal history of antineoplastic chemotherapy: Secondary | ICD-10-CM | POA: Diagnosis not present

## 2023-12-29 DIAGNOSIS — I10 Essential (primary) hypertension: Secondary | ICD-10-CM | POA: Diagnosis not present

## 2023-12-29 DIAGNOSIS — M7989 Other specified soft tissue disorders: Secondary | ICD-10-CM | POA: Diagnosis not present

## 2024-01-15 DIAGNOSIS — I480 Paroxysmal atrial fibrillation: Secondary | ICD-10-CM | POA: Diagnosis not present

## 2024-01-15 DIAGNOSIS — M7989 Other specified soft tissue disorders: Secondary | ICD-10-CM | POA: Diagnosis not present

## 2024-01-15 DIAGNOSIS — I77810 Thoracic aortic ectasia: Secondary | ICD-10-CM | POA: Diagnosis not present

## 2024-01-15 DIAGNOSIS — R9439 Abnormal result of other cardiovascular function study: Secondary | ICD-10-CM | POA: Diagnosis not present

## 2024-01-15 DIAGNOSIS — Z9221 Personal history of antineoplastic chemotherapy: Secondary | ICD-10-CM | POA: Diagnosis not present

## 2024-01-17 DIAGNOSIS — I480 Paroxysmal atrial fibrillation: Secondary | ICD-10-CM | POA: Diagnosis not present

## 2024-01-17 DIAGNOSIS — Z9221 Personal history of antineoplastic chemotherapy: Secondary | ICD-10-CM | POA: Diagnosis not present

## 2024-03-01 DIAGNOSIS — C8463 Anaplastic large cell lymphoma, ALK-positive, intra-abdominal lymph nodes: Secondary | ICD-10-CM | POA: Diagnosis not present

## 2024-03-16 DIAGNOSIS — G620 Drug-induced polyneuropathy: Secondary | ICD-10-CM | POA: Diagnosis not present

## 2024-03-16 DIAGNOSIS — C8463 Anaplastic large cell lymphoma, ALK-positive, intra-abdominal lymph nodes: Secondary | ICD-10-CM | POA: Diagnosis not present

## 2024-03-16 DIAGNOSIS — G894 Chronic pain syndrome: Secondary | ICD-10-CM | POA: Diagnosis not present

## 2024-05-09 ENCOUNTER — Telehealth: Payer: Self-pay

## 2024-05-09 NOTE — Telephone Encounter (Signed)
 Copied from CRM #[id number]. Topic: Clinical - Order For Equipment >> May 09, 2024  3:52 PM Isabell A wrote: Reason for CRM: Spouse calling on behalf of patient, she is unsure which company they have received their oxygen  tank from - states its not working properly.  Callback number: [phone]  [facility] x1 left a detailed message to let patient know that they have received the tanks from [employer] and  that they need to contact them if they are having issues . Patient was advised to call back .

## 2024-06-16 DIAGNOSIS — T451X5A Adverse effect of antineoplastic and immunosuppressive drugs, initial encounter: Secondary | ICD-10-CM | POA: Diagnosis not present

## 2024-06-16 DIAGNOSIS — C8463 Anaplastic large cell lymphoma, ALK-positive, intra-abdominal lymph nodes: Secondary | ICD-10-CM | POA: Diagnosis not present

## 2024-06-16 DIAGNOSIS — G620 Drug-induced polyneuropathy: Secondary | ICD-10-CM | POA: Diagnosis not present

## 2024-06-16 DIAGNOSIS — J849 Interstitial pulmonary disease, unspecified: Secondary | ICD-10-CM | POA: Diagnosis not present

## 2024-06-22 DIAGNOSIS — I480 Paroxysmal atrial fibrillation: Secondary | ICD-10-CM | POA: Diagnosis not present

## 2024-06-22 DIAGNOSIS — R739 Hyperglycemia, unspecified: Secondary | ICD-10-CM | POA: Diagnosis not present

## 2024-06-22 DIAGNOSIS — C8580 Other specified types of non-Hodgkin lymphoma, unspecified site: Secondary | ICD-10-CM | POA: Diagnosis not present

## 2024-06-22 DIAGNOSIS — I10 Essential (primary) hypertension: Secondary | ICD-10-CM | POA: Diagnosis not present
